# Patient Record
Sex: Male | Born: 1960
Health system: Southern US, Community
[De-identification: ages and names within clinical notes are randomized; demographics above are authoritative.]

## PROBLEM LIST (undated history)

## (undated) DIAGNOSIS — M549 Dorsalgia, unspecified: Secondary | ICD-10-CM

## (undated) DIAGNOSIS — E119 Type 2 diabetes mellitus without complications: Secondary | ICD-10-CM

## (undated) HISTORY — PX: BACK SURGERY: SHX140

---

## 2014-03-14 DIAGNOSIS — M1732 Unilateral post-traumatic osteoarthritis, left knee: Secondary | ICD-10-CM | POA: Insufficient documentation

## 2015-06-26 DIAGNOSIS — E119 Type 2 diabetes mellitus without complications: Secondary | ICD-10-CM | POA: Diagnosis not present

## 2015-06-26 DIAGNOSIS — Z79899 Other long term (current) drug therapy: Secondary | ICD-10-CM | POA: Diagnosis not present

## 2015-06-26 DIAGNOSIS — M25561 Pain in right knee: Secondary | ICD-10-CM | POA: Diagnosis not present

## 2015-06-26 DIAGNOSIS — I10 Essential (primary) hypertension: Secondary | ICD-10-CM | POA: Diagnosis not present

## 2015-06-26 DIAGNOSIS — M545 Low back pain: Secondary | ICD-10-CM | POA: Diagnosis not present

## 2015-07-25 DIAGNOSIS — R5383 Other fatigue: Secondary | ICD-10-CM | POA: Diagnosis not present

## 2015-07-25 DIAGNOSIS — D649 Anemia, unspecified: Secondary | ICD-10-CM | POA: Diagnosis not present

## 2015-07-25 DIAGNOSIS — M129 Arthropathy, unspecified: Secondary | ICD-10-CM | POA: Diagnosis not present

## 2015-07-25 DIAGNOSIS — D539 Nutritional anemia, unspecified: Secondary | ICD-10-CM | POA: Diagnosis not present

## 2015-07-25 DIAGNOSIS — E291 Testicular hypofunction: Secondary | ICD-10-CM | POA: Diagnosis not present

## 2015-07-25 DIAGNOSIS — E7800 Pure hypercholesterolemia, unspecified: Secondary | ICD-10-CM | POA: Diagnosis not present

## 2015-07-25 DIAGNOSIS — Z79899 Other long term (current) drug therapy: Secondary | ICD-10-CM | POA: Diagnosis not present

## 2015-07-25 DIAGNOSIS — I10 Essential (primary) hypertension: Secondary | ICD-10-CM | POA: Diagnosis not present

## 2015-07-25 DIAGNOSIS — R0602 Shortness of breath: Secondary | ICD-10-CM | POA: Diagnosis not present

## 2015-07-25 DIAGNOSIS — E119 Type 2 diabetes mellitus without complications: Secondary | ICD-10-CM | POA: Diagnosis not present

## 2015-07-25 DIAGNOSIS — E559 Vitamin D deficiency, unspecified: Secondary | ICD-10-CM | POA: Diagnosis not present

## 2015-08-06 DIAGNOSIS — M5440 Lumbago with sciatica, unspecified side: Secondary | ICD-10-CM | POA: Diagnosis present

## 2015-08-06 DIAGNOSIS — R0602 Shortness of breath: Secondary | ICD-10-CM | POA: Diagnosis not present

## 2015-08-06 DIAGNOSIS — G8929 Other chronic pain: Secondary | ICD-10-CM | POA: Diagnosis present

## 2015-08-06 DIAGNOSIS — I10 Essential (primary) hypertension: Secondary | ICD-10-CM | POA: Diagnosis present

## 2015-08-06 DIAGNOSIS — M549 Dorsalgia, unspecified: Secondary | ICD-10-CM | POA: Diagnosis not present

## 2015-08-06 DIAGNOSIS — F419 Anxiety disorder, unspecified: Secondary | ICD-10-CM | POA: Diagnosis present

## 2015-08-06 DIAGNOSIS — Z6841 Body Mass Index (BMI) 40.0 and over, adult: Secondary | ICD-10-CM | POA: Diagnosis not present

## 2015-08-06 DIAGNOSIS — R0689 Other abnormalities of breathing: Secondary | ICD-10-CM | POA: Diagnosis not present

## 2015-08-06 DIAGNOSIS — F17210 Nicotine dependence, cigarettes, uncomplicated: Secondary | ICD-10-CM | POA: Diagnosis present

## 2015-08-06 DIAGNOSIS — R740 Nonspecific elevation of levels of transaminase and lactic acid dehydrogenase [LDH]: Secondary | ICD-10-CM | POA: Diagnosis not present

## 2015-08-06 DIAGNOSIS — M1990 Unspecified osteoarthritis, unspecified site: Secondary | ICD-10-CM | POA: Diagnosis present

## 2015-08-06 DIAGNOSIS — R0902 Hypoxemia: Secondary | ICD-10-CM | POA: Diagnosis not present

## 2015-08-06 DIAGNOSIS — Z9119 Patient's noncompliance with other medical treatment and regimen: Secondary | ICD-10-CM | POA: Diagnosis not present

## 2015-08-06 DIAGNOSIS — J180 Bronchopneumonia, unspecified organism: Secondary | ICD-10-CM | POA: Diagnosis present

## 2015-08-06 DIAGNOSIS — E1165 Type 2 diabetes mellitus with hyperglycemia: Secondary | ICD-10-CM | POA: Diagnosis not present

## 2015-08-06 DIAGNOSIS — E7800 Pure hypercholesterolemia, unspecified: Secondary | ICD-10-CM | POA: Diagnosis present

## 2015-08-06 DIAGNOSIS — R935 Abnormal findings on diagnostic imaging of other abdominal regions, including retroperitoneum: Secondary | ICD-10-CM | POA: Diagnosis present

## 2015-08-06 DIAGNOSIS — K760 Fatty (change of) liver, not elsewhere classified: Secondary | ICD-10-CM | POA: Diagnosis present

## 2015-08-06 DIAGNOSIS — Z79899 Other long term (current) drug therapy: Secondary | ICD-10-CM | POA: Diagnosis not present

## 2015-08-06 DIAGNOSIS — K219 Gastro-esophageal reflux disease without esophagitis: Secondary | ICD-10-CM | POA: Diagnosis present

## 2015-08-06 DIAGNOSIS — Z8701 Personal history of pneumonia (recurrent): Secondary | ICD-10-CM | POA: Diagnosis not present

## 2015-08-06 DIAGNOSIS — J9600 Acute respiratory failure, unspecified whether with hypoxia or hypercapnia: Secondary | ICD-10-CM | POA: Diagnosis present

## 2015-08-06 DIAGNOSIS — Z881 Allergy status to other antibiotic agents status: Secondary | ICD-10-CM | POA: Diagnosis not present

## 2015-08-06 DIAGNOSIS — G4733 Obstructive sleep apnea (adult) (pediatric): Secondary | ICD-10-CM | POA: Diagnosis present

## 2015-08-06 DIAGNOSIS — R7989 Other specified abnormal findings of blood chemistry: Secondary | ICD-10-CM | POA: Diagnosis not present

## 2015-08-06 DIAGNOSIS — Z20828 Contact with and (suspected) exposure to other viral communicable diseases: Secondary | ICD-10-CM | POA: Diagnosis present

## 2015-08-06 DIAGNOSIS — Z23 Encounter for immunization: Secondary | ICD-10-CM | POA: Diagnosis not present

## 2015-08-06 DIAGNOSIS — J111 Influenza due to unidentified influenza virus with other respiratory manifestations: Secondary | ICD-10-CM | POA: Diagnosis not present

## 2015-08-06 DIAGNOSIS — I519 Heart disease, unspecified: Secondary | ICD-10-CM | POA: Diagnosis present

## 2015-08-06 DIAGNOSIS — E860 Dehydration: Secondary | ICD-10-CM | POA: Diagnosis not present

## 2015-08-06 DIAGNOSIS — R05 Cough: Secondary | ICD-10-CM | POA: Diagnosis not present

## 2015-08-22 DIAGNOSIS — Z79899 Other long term (current) drug therapy: Secondary | ICD-10-CM | POA: Diagnosis not present

## 2015-08-22 DIAGNOSIS — M545 Low back pain: Secondary | ICD-10-CM | POA: Diagnosis not present

## 2015-08-22 DIAGNOSIS — M5416 Radiculopathy, lumbar region: Secondary | ICD-10-CM | POA: Diagnosis not present

## 2015-08-22 DIAGNOSIS — E119 Type 2 diabetes mellitus without complications: Secondary | ICD-10-CM | POA: Diagnosis not present

## 2015-08-22 DIAGNOSIS — I10 Essential (primary) hypertension: Secondary | ICD-10-CM | POA: Diagnosis not present

## 2015-09-22 DIAGNOSIS — M5136 Other intervertebral disc degeneration, lumbar region: Secondary | ICD-10-CM | POA: Diagnosis not present

## 2015-09-22 DIAGNOSIS — I10 Essential (primary) hypertension: Secondary | ICD-10-CM | POA: Diagnosis not present

## 2015-09-22 DIAGNOSIS — Z79899 Other long term (current) drug therapy: Secondary | ICD-10-CM | POA: Diagnosis not present

## 2015-09-22 DIAGNOSIS — E119 Type 2 diabetes mellitus without complications: Secondary | ICD-10-CM | POA: Diagnosis not present

## 2015-09-22 DIAGNOSIS — M545 Low back pain: Secondary | ICD-10-CM | POA: Diagnosis not present

## 2015-10-21 DIAGNOSIS — I10 Essential (primary) hypertension: Secondary | ICD-10-CM | POA: Diagnosis not present

## 2015-10-21 DIAGNOSIS — E119 Type 2 diabetes mellitus without complications: Secondary | ICD-10-CM | POA: Diagnosis not present

## 2015-10-21 DIAGNOSIS — M25561 Pain in right knee: Secondary | ICD-10-CM | POA: Diagnosis not present

## 2015-10-21 DIAGNOSIS — M25562 Pain in left knee: Secondary | ICD-10-CM | POA: Diagnosis not present

## 2015-10-21 DIAGNOSIS — M545 Low back pain: Secondary | ICD-10-CM | POA: Diagnosis not present

## 2015-10-21 DIAGNOSIS — Z79899 Other long term (current) drug therapy: Secondary | ICD-10-CM | POA: Diagnosis not present

## 2015-10-24 DIAGNOSIS — E119 Type 2 diabetes mellitus without complications: Secondary | ICD-10-CM | POA: Diagnosis not present

## 2015-10-24 DIAGNOSIS — S2241XA Multiple fractures of ribs, right side, initial encounter for closed fracture: Secondary | ICD-10-CM | POA: Diagnosis not present

## 2015-10-24 DIAGNOSIS — Z883 Allergy status to other anti-infective agents status: Secondary | ICD-10-CM | POA: Diagnosis not present

## 2015-10-24 DIAGNOSIS — R7989 Other specified abnormal findings of blood chemistry: Secondary | ICD-10-CM | POA: Diagnosis not present

## 2015-10-24 DIAGNOSIS — R911 Solitary pulmonary nodule: Secondary | ICD-10-CM | POA: Diagnosis not present

## 2015-10-24 DIAGNOSIS — K5730 Diverticulosis of large intestine without perforation or abscess without bleeding: Secondary | ICD-10-CM | POA: Diagnosis not present

## 2015-11-28 DIAGNOSIS — Z79899 Other long term (current) drug therapy: Secondary | ICD-10-CM | POA: Diagnosis not present

## 2015-11-28 DIAGNOSIS — I10 Essential (primary) hypertension: Secondary | ICD-10-CM | POA: Diagnosis not present

## 2015-11-28 DIAGNOSIS — M5136 Other intervertebral disc degeneration, lumbar region: Secondary | ICD-10-CM | POA: Diagnosis not present

## 2015-11-28 DIAGNOSIS — M545 Low back pain: Secondary | ICD-10-CM | POA: Diagnosis not present

## 2015-11-28 DIAGNOSIS — M5416 Radiculopathy, lumbar region: Secondary | ICD-10-CM | POA: Diagnosis not present

## 2015-12-26 DIAGNOSIS — M545 Low back pain: Secondary | ICD-10-CM | POA: Diagnosis not present

## 2015-12-26 DIAGNOSIS — E291 Testicular hypofunction: Secondary | ICD-10-CM | POA: Diagnosis not present

## 2015-12-26 DIAGNOSIS — Z79899 Other long term (current) drug therapy: Secondary | ICD-10-CM | POA: Diagnosis not present

## 2015-12-26 DIAGNOSIS — R5383 Other fatigue: Secondary | ICD-10-CM | POA: Diagnosis not present

## 2015-12-26 DIAGNOSIS — M5136 Other intervertebral disc degeneration, lumbar region: Secondary | ICD-10-CM | POA: Diagnosis not present

## 2015-12-26 DIAGNOSIS — E559 Vitamin D deficiency, unspecified: Secondary | ICD-10-CM | POA: Diagnosis not present

## 2015-12-26 DIAGNOSIS — E119 Type 2 diabetes mellitus without complications: Secondary | ICD-10-CM | POA: Diagnosis not present

## 2015-12-26 DIAGNOSIS — D539 Nutritional anemia, unspecified: Secondary | ICD-10-CM | POA: Diagnosis not present

## 2015-12-26 DIAGNOSIS — M129 Arthropathy, unspecified: Secondary | ICD-10-CM | POA: Diagnosis not present

## 2015-12-26 DIAGNOSIS — R0602 Shortness of breath: Secondary | ICD-10-CM | POA: Diagnosis not present

## 2015-12-26 DIAGNOSIS — E7800 Pure hypercholesterolemia, unspecified: Secondary | ICD-10-CM | POA: Diagnosis not present

## 2015-12-26 DIAGNOSIS — R21 Rash and other nonspecific skin eruption: Secondary | ICD-10-CM | POA: Diagnosis not present

## 2015-12-26 DIAGNOSIS — R300 Dysuria: Secondary | ICD-10-CM | POA: Diagnosis not present

## 2016-01-02 ENCOUNTER — Emergency Department (HOSPITAL_COMMUNITY): Payer: No Typology Code available for payment source

## 2016-01-02 ENCOUNTER — Encounter (HOSPITAL_COMMUNITY): Payer: Self-pay | Admitting: Emergency Medicine

## 2016-01-02 ENCOUNTER — Emergency Department (HOSPITAL_COMMUNITY)
Admission: EM | Admit: 2016-01-02 | Discharge: 2016-01-02 | Disposition: A | Discharge status: Home / Self Care | Payer: No Typology Code available for payment source | Attending: Emergency Medicine | Admitting: Emergency Medicine

## 2016-01-02 DIAGNOSIS — Y92410 Unspecified street and highway as the place of occurrence of the external cause: Secondary | ICD-10-CM | POA: Diagnosis not present

## 2016-01-02 DIAGNOSIS — Y939 Activity, unspecified: Secondary | ICD-10-CM | POA: Diagnosis not present

## 2016-01-02 DIAGNOSIS — Y999 Unspecified external cause status: Secondary | ICD-10-CM | POA: Insufficient documentation

## 2016-01-02 DIAGNOSIS — S39012A Strain of muscle, fascia and tendon of lower back, initial encounter: Secondary | ICD-10-CM | POA: Insufficient documentation

## 2016-01-02 DIAGNOSIS — E119 Type 2 diabetes mellitus without complications: Secondary | ICD-10-CM | POA: Insufficient documentation

## 2016-01-02 DIAGNOSIS — S3992XA Unspecified injury of lower back, initial encounter: Secondary | ICD-10-CM | POA: Diagnosis present

## 2016-01-02 HISTORY — DX: Type 2 diabetes mellitus without complications: E11.9

## 2016-01-02 HISTORY — DX: Dorsalgia, unspecified: M54.9

## 2016-01-02 MED ORDER — NAPROXEN 500 MG PO TABS: 500.0000 mg | ORAL_TABLET | Freq: Two times a day (BID) | ORAL | Status: DC

## 2016-01-02 MED ORDER — CYCLOBENZAPRINE HCL 5 MG PO TABS: 5.0000 mg | ORAL_TABLET | Freq: Two times a day (BID) | ORAL | Status: DC | PRN

## 2016-01-02 NOTE — ED Notes (Signed)
Patient transported to X-ray 
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
## 2016-01-26 DIAGNOSIS — S39012A Strain of muscle, fascia and tendon of lower back, initial encounter: Secondary | ICD-10-CM | POA: Diagnosis not present

## 2016-01-26 DIAGNOSIS — Z79899 Other long term (current) drug therapy: Secondary | ICD-10-CM | POA: Diagnosis not present

## 2016-01-26 DIAGNOSIS — M545 Low back pain: Secondary | ICD-10-CM | POA: Diagnosis not present

## 2016-01-26 DIAGNOSIS — M62838 Other muscle spasm: Secondary | ICD-10-CM | POA: Diagnosis not present

## 2016-01-26 DIAGNOSIS — M5136 Other intervertebral disc degeneration, lumbar region: Secondary | ICD-10-CM | POA: Diagnosis not present

## 2016-04-06 DIAGNOSIS — S6991XA Unspecified injury of right wrist, hand and finger(s), initial encounter: Secondary | ICD-10-CM | POA: Diagnosis not present

## 2016-04-06 DIAGNOSIS — M79641 Pain in right hand: Secondary | ICD-10-CM | POA: Diagnosis not present

## 2016-04-06 DIAGNOSIS — S60011A Contusion of right thumb without damage to nail, initial encounter: Secondary | ICD-10-CM | POA: Diagnosis not present

## 2016-05-04 DIAGNOSIS — R5382 Chronic fatigue, unspecified: Secondary | ICD-10-CM | POA: Diagnosis not present

## 2016-06-14 DIAGNOSIS — E1165 Type 2 diabetes mellitus with hyperglycemia: Secondary | ICD-10-CM | POA: Diagnosis not present

## 2016-06-14 DIAGNOSIS — E118 Type 2 diabetes mellitus with unspecified complications: Secondary | ICD-10-CM | POA: Diagnosis not present

## 2016-06-14 DIAGNOSIS — R110 Nausea: Secondary | ICD-10-CM | POA: Diagnosis not present

## 2016-06-14 DIAGNOSIS — I10 Essential (primary) hypertension: Secondary | ICD-10-CM | POA: Diagnosis not present

## 2016-06-24 DIAGNOSIS — E1165 Type 2 diabetes mellitus with hyperglycemia: Secondary | ICD-10-CM | POA: Insufficient documentation

## 2016-06-24 DIAGNOSIS — F1121 Opioid dependence, in remission: Secondary | ICD-10-CM | POA: Insufficient documentation

## 2016-06-24 DIAGNOSIS — I10 Essential (primary) hypertension: Secondary | ICD-10-CM | POA: Insufficient documentation

## 2016-10-10 DIAGNOSIS — M545 Low back pain: Secondary | ICD-10-CM | POA: Diagnosis not present

## 2016-10-10 DIAGNOSIS — Z7984 Long term (current) use of oral hypoglycemic drugs: Secondary | ICD-10-CM | POA: Diagnosis not present

## 2016-10-10 DIAGNOSIS — M5489 Other dorsalgia: Secondary | ICD-10-CM | POA: Diagnosis not present

## 2016-10-10 DIAGNOSIS — T148XXA Other injury of unspecified body region, initial encounter: Secondary | ICD-10-CM | POA: Diagnosis not present

## 2016-10-10 DIAGNOSIS — Z79899 Other long term (current) drug therapy: Secondary | ICD-10-CM | POA: Diagnosis not present

## 2016-10-10 DIAGNOSIS — E119 Type 2 diabetes mellitus without complications: Secondary | ICD-10-CM | POA: Diagnosis not present

## 2016-10-10 DIAGNOSIS — I10 Essential (primary) hypertension: Secondary | ICD-10-CM | POA: Diagnosis not present

## 2016-10-29 DIAGNOSIS — G4733 Obstructive sleep apnea (adult) (pediatric): Secondary | ICD-10-CM | POA: Insufficient documentation

## 2016-11-01 DIAGNOSIS — Z6841 Body Mass Index (BMI) 40.0 and over, adult: Secondary | ICD-10-CM | POA: Insufficient documentation

## 2017-03-28 DIAGNOSIS — Z79811 Long term (current) use of aromatase inhibitors: Secondary | ICD-10-CM | POA: Diagnosis not present

## 2017-03-28 DIAGNOSIS — Z794 Long term (current) use of insulin: Secondary | ICD-10-CM | POA: Diagnosis not present

## 2017-03-28 DIAGNOSIS — E1165 Type 2 diabetes mellitus with hyperglycemia: Secondary | ICD-10-CM | POA: Diagnosis not present

## 2017-03-28 DIAGNOSIS — E1010 Type 1 diabetes mellitus with ketoacidosis without coma: Secondary | ICD-10-CM | POA: Diagnosis not present

## 2017-03-28 DIAGNOSIS — E1169 Type 2 diabetes mellitus with other specified complication: Secondary | ICD-10-CM | POA: Diagnosis present

## 2017-03-28 DIAGNOSIS — M4623 Osteomyelitis of vertebra, cervicothoracic region: Secondary | ICD-10-CM | POA: Diagnosis not present

## 2017-03-28 DIAGNOSIS — T383X5A Adverse effect of insulin and oral hypoglycemic [antidiabetic] drugs, initial encounter: Secondary | ICD-10-CM | POA: Diagnosis not present

## 2017-03-28 DIAGNOSIS — R109 Unspecified abdominal pain: Secondary | ICD-10-CM | POA: Diagnosis not present

## 2017-03-28 DIAGNOSIS — K7689 Other specified diseases of liver: Secondary | ICD-10-CM | POA: Diagnosis not present

## 2017-03-28 DIAGNOSIS — E785 Hyperlipidemia, unspecified: Secondary | ICD-10-CM | POA: Diagnosis present

## 2017-03-28 DIAGNOSIS — S32030A Wedge compression fracture of third lumbar vertebra, initial encounter for closed fracture: Secondary | ICD-10-CM | POA: Diagnosis not present

## 2017-03-28 DIAGNOSIS — M4626 Osteomyelitis of vertebra, lumbar region: Secondary | ICD-10-CM | POA: Diagnosis present

## 2017-03-28 DIAGNOSIS — R1084 Generalized abdominal pain: Secondary | ICD-10-CM | POA: Diagnosis not present

## 2017-03-28 DIAGNOSIS — T383X6A Underdosing of insulin and oral hypoglycemic [antidiabetic] drugs, initial encounter: Secondary | ICD-10-CM | POA: Diagnosis not present

## 2017-03-28 DIAGNOSIS — M868X8 Other osteomyelitis, other site: Secondary | ICD-10-CM | POA: Diagnosis not present

## 2017-03-28 DIAGNOSIS — Z91128 Patient's intentional underdosing of medication regimen for other reason: Secondary | ICD-10-CM | POA: Diagnosis not present

## 2017-03-28 DIAGNOSIS — M5416 Radiculopathy, lumbar region: Secondary | ICD-10-CM | POA: Diagnosis not present

## 2017-03-28 DIAGNOSIS — E1110 Type 2 diabetes mellitus with ketoacidosis without coma: Secondary | ICD-10-CM | POA: Diagnosis present

## 2017-03-28 DIAGNOSIS — R000 Tachycardia, unspecified: Secondary | ICD-10-CM | POA: Diagnosis not present

## 2017-03-28 DIAGNOSIS — D72829 Elevated white blood cell count, unspecified: Secondary | ICD-10-CM | POA: Diagnosis not present

## 2017-03-28 DIAGNOSIS — D1809 Hemangioma of other sites: Secondary | ICD-10-CM | POA: Diagnosis not present

## 2017-03-28 DIAGNOSIS — E1069 Type 1 diabetes mellitus with other specified complication: Secondary | ICD-10-CM | POA: Diagnosis present

## 2017-03-28 DIAGNOSIS — R739 Hyperglycemia, unspecified: Secondary | ICD-10-CM | POA: Diagnosis not present

## 2017-03-28 DIAGNOSIS — F40240 Claustrophobia: Secondary | ICD-10-CM | POA: Diagnosis present

## 2017-03-28 DIAGNOSIS — M5410 Radiculopathy, site unspecified: Secondary | ICD-10-CM | POA: Diagnosis present

## 2017-03-28 DIAGNOSIS — I10 Essential (primary) hypertension: Secondary | ICD-10-CM | POA: Diagnosis present

## 2017-04-01 DIAGNOSIS — S32039A Unspecified fracture of third lumbar vertebra, initial encounter for closed fracture: Secondary | ICD-10-CM | POA: Insufficient documentation

## 2017-04-29 DIAGNOSIS — M48062 Spinal stenosis, lumbar region with neurogenic claudication: Secondary | ICD-10-CM | POA: Diagnosis not present

## 2017-04-29 DIAGNOSIS — M5416 Radiculopathy, lumbar region: Secondary | ICD-10-CM | POA: Insufficient documentation

## 2017-11-26 DIAGNOSIS — R531 Weakness: Secondary | ICD-10-CM | POA: Diagnosis not present

## 2017-11-26 DIAGNOSIS — E1165 Type 2 diabetes mellitus with hyperglycemia: Secondary | ICD-10-CM | POA: Diagnosis not present

## 2018-04-03 DIAGNOSIS — G4733 Obstructive sleep apnea (adult) (pediatric): Secondary | ICD-10-CM | POA: Diagnosis not present

## 2018-04-03 DIAGNOSIS — Z79899 Other long term (current) drug therapy: Secondary | ICD-10-CM | POA: Diagnosis not present

## 2018-04-03 DIAGNOSIS — Z23 Encounter for immunization: Secondary | ICD-10-CM | POA: Diagnosis not present

## 2018-04-03 DIAGNOSIS — F320 Major depressive disorder, single episode, mild: Secondary | ICD-10-CM | POA: Diagnosis not present

## 2018-04-03 DIAGNOSIS — Z794 Long term (current) use of insulin: Secondary | ICD-10-CM | POA: Diagnosis not present

## 2018-04-03 DIAGNOSIS — E7800 Pure hypercholesterolemia, unspecified: Secondary | ICD-10-CM | POA: Insufficient documentation

## 2018-04-03 DIAGNOSIS — Z6841 Body Mass Index (BMI) 40.0 and over, adult: Secondary | ICD-10-CM | POA: Diagnosis not present

## 2018-04-03 DIAGNOSIS — E1165 Type 2 diabetes mellitus with hyperglycemia: Secondary | ICD-10-CM | POA: Diagnosis not present

## 2018-04-03 DIAGNOSIS — Z1159 Encounter for screening for other viral diseases: Secondary | ICD-10-CM | POA: Diagnosis not present

## 2018-04-03 DIAGNOSIS — I10 Essential (primary) hypertension: Secondary | ICD-10-CM | POA: Diagnosis not present

## 2018-05-20 DIAGNOSIS — R52 Pain, unspecified: Secondary | ICD-10-CM | POA: Diagnosis not present

## 2018-05-20 DIAGNOSIS — I10 Essential (primary) hypertension: Secondary | ICD-10-CM | POA: Diagnosis not present

## 2018-05-20 DIAGNOSIS — G4489 Other headache syndrome: Secondary | ICD-10-CM | POA: Diagnosis not present

## 2018-05-20 DIAGNOSIS — R0602 Shortness of breath: Secondary | ICD-10-CM | POA: Diagnosis not present

## 2018-05-20 DIAGNOSIS — R42 Dizziness and giddiness: Secondary | ICD-10-CM | POA: Diagnosis not present

## 2019-01-11 ENCOUNTER — Ambulatory Visit
Admission: RE | Admit: 2019-01-11 | Discharge: 2019-01-11 | Disposition: A | Discharge status: Home / Self Care | Payer: Medicare Other | Source: Ambulatory Visit | Attending: Family | Admitting: Family

## 2019-01-11 ENCOUNTER — Other Ambulatory Visit: Payer: Self-pay | Admitting: Family Medicine

## 2019-01-11 DIAGNOSIS — R0781 Pleurodynia: Secondary | ICD-10-CM

## 2019-01-11 DIAGNOSIS — S299XXA Unspecified injury of thorax, initial encounter: Secondary | ICD-10-CM | POA: Diagnosis not present

## 2019-02-16 DIAGNOSIS — E1169 Type 2 diabetes mellitus with other specified complication: Secondary | ICD-10-CM | POA: Diagnosis not present

## 2019-02-16 DIAGNOSIS — Z79899 Other long term (current) drug therapy: Secondary | ICD-10-CM | POA: Diagnosis not present

## 2019-02-16 DIAGNOSIS — Z1159 Encounter for screening for other viral diseases: Secondary | ICD-10-CM | POA: Diagnosis not present

## 2019-07-02 ENCOUNTER — Emergency Department (HOSPITAL_COMMUNITY)
Admission: EM | Admit: 2019-07-02 | Discharge: 2019-07-02 | Disposition: A | Discharge status: Home / Self Care | Payer: Medicare Other | Attending: Emergency Medicine | Admitting: Emergency Medicine

## 2019-07-02 ENCOUNTER — Other Ambulatory Visit: Payer: Self-pay

## 2019-07-02 ENCOUNTER — Emergency Department (HOSPITAL_COMMUNITY): Payer: Medicare Other

## 2019-07-02 DIAGNOSIS — Y929 Unspecified place or not applicable: Secondary | ICD-10-CM | POA: Diagnosis not present

## 2019-07-02 DIAGNOSIS — Y999 Unspecified external cause status: Secondary | ICD-10-CM | POA: Diagnosis not present

## 2019-07-02 DIAGNOSIS — S83411A Sprain of medial collateral ligament of right knee, initial encounter: Secondary | ICD-10-CM | POA: Diagnosis not present

## 2019-07-02 DIAGNOSIS — Z79899 Other long term (current) drug therapy: Secondary | ICD-10-CM | POA: Diagnosis not present

## 2019-07-02 DIAGNOSIS — Y939 Activity, unspecified: Secondary | ICD-10-CM | POA: Insufficient documentation

## 2019-07-02 DIAGNOSIS — S8991XA Unspecified injury of right lower leg, initial encounter: Secondary | ICD-10-CM | POA: Diagnosis present

## 2019-07-02 DIAGNOSIS — W19XXXA Unspecified fall, initial encounter: Secondary | ICD-10-CM | POA: Diagnosis not present

## 2019-07-02 DIAGNOSIS — E119 Type 2 diabetes mellitus without complications: Secondary | ICD-10-CM | POA: Insufficient documentation

## 2019-07-02 MED ORDER — IBUPROFEN 800 MG PO TABS: 800.0000 mg | ORAL_TABLET | Freq: Three times a day (TID) | ORAL | 0 refills | Status: DC

## 2019-07-02 NOTE — ED Provider Notes (Signed)
[facility] Provider Note   CSN: [id number] Arrival date & time: 07/02/19  1031     History Chief Complaint  Patient presents with  . Knee Pain    Charles Nunez is a 59 y.o. male with PMH significant for type II DM who presents to the ED with complaints of right knee pain.  Patient reports that approximately 5 days ago he was walking down the stairs when he slipped and got his right foot caught between the balusters and he fell forward, externally rotating his hip and causing significant medial knee discomfort.  Patient reportedly was able to ambulate after the injury and spent time with his grandkids who were in town from [place].  He has not been taking any for his pain or discomfort, but reports that he does have ibuprofen at home.  He reports that when he walks for extended period time, he feels as though his knee is "buckling" as though it will give out on him.  He was sent here by his primary care provider to obtain imaging to rule out fracture or dislocation.  He denies any numbness or tingling, shooting pains, hip discomfort, ankle discomfort, inability to weight-bear or ambulate, fevers or chills, overlying skin changes, or other symptoms.  HPI     Past Medical History:  Diagnosis Date  . Back pain   . Diabetes mellitus without complication ([place])     There are no problems to display for this patient.   No family history on file.  Social History   Tobacco Use  . Smoking status: Never Smoker  Substance Use Topics  . Alcohol use: No  . Drug use: Not on file    Home Medications Prior to Admission medications   Medication Sig Start Date End Date Taking? Authorizing Provider  cyclobenzaprine (FLEXERIL) 5 MG tablet Take 1 tablet (5 mg total) by mouth 2 (two) times daily as needed for muscle spasms. 01/02/16   Margarita Mail, PA-C  ibuprofen (ADVIL) 800 MG tablet Take 1 tablet (800 mg total) by mouth 3 (three) times daily. 07/02/19    Corena Herter, PA-C  naproxen (NAPROSYN) 500 MG tablet Take 1 tablet (500 mg total) by mouth 2 (two) times daily with a meal. 01/02/16   Margarita Mail, PA-C    Allergies    Patient has no known allergies.  Review of Systems   Review of Systems  Constitutional: Negative for chills and fever.  Musculoskeletal: Positive for arthralgias, gait problem and joint swelling.  Skin: Negative for color change and wound.  Neurological: Negative for numbness.    Physical Exam Updated Vital Signs BP (!) 160/138 (BP Location: Right Arm)   Pulse 79   Temp 98.2 F (36.8 C) (Oral)   Resp 16   Ht 5\' 10"  (1.778 m)   Wt (!) 138.3 kg   SpO2 98%   BMI 43.76 kg/m   Physical Exam Vitals and nursing note reviewed. Exam conducted with a chaperone present.  Constitutional:      Appearance: Normal appearance.  HENT:     Head: Normocephalic and atraumatic.  Eyes:     General: No scleral icterus.    Conjunctiva/sclera: Conjunctivae normal.  Cardiovascular:     Rate and Rhythm: Normal rate and regular rhythm.     Pulses: Normal pulses.     Heart sounds: Normal heart sounds.  Pulmonary:     Effort: Pulmonary effort is normal.  Musculoskeletal:     Cervical back: Normal range of  motion and neck supple. No rigidity.     Comments: Right knee: No significant swelling or overlying skin changes.  Exquisite tenderness to palpation over medial aspect.  Discomfort felt deep to the patella with patellar palpation.  No popliteal tenderness.  Distal sensation, pulses, and cap refill intact.  Flexion and extension intact, albeit limited.  Pain with valgus stress testing. Right hip: ROM and strength fully intact.  No TTP.   Right ankle: ROM and strength fully intact.  No TTP.  Skin:    General: Skin is dry.  Neurological:     Mental Status: He is alert.     GCS: GCS eye subscore is 4. GCS verbal subscore is 5. GCS motor subscore is 6.  Psychiatric:        Mood and Affect: Mood normal.        Behavior:  Behavior normal.        Thought Content: Thought content normal.       ED Results / Procedures / Treatments   Labs (all labs ordered are listed, but only abnormal results are displayed) Labs Reviewed - No data to display  EKG None  Radiology DG Knee Complete 4 Views Right  Result Date: 07/02/2019 CLINICAL DATA:  Acute right knee pain after fall last week. EXAM: RIGHT KNEE - COMPLETE 4+ VIEW COMPARISON:  December 14, 2013. FINDINGS: No evidence of fracture, dislocation, or joint effusion. No evidence of arthropathy or other focal bone abnormality. Soft tissues are unremarkable. IMPRESSION: Negative. Electronically Signed   By: Marijo Conception M.D.   On: 07/02/2019 11:27    Procedures Procedures (including critical care time)  Medications Ordered in ED Medications - No data to display  ED Course  I have reviewed the triage vital signs and the nursing notes.  Pertinent labs & imaging results that were available during my care of the patient were reviewed by me and considered in my medical decision making (see chart for details).    MDM Rules/Calculators/A&P                      Patient's history and physical exam is consistent with an MCL strain.  DG right knee complete demonstrates no evidence of fracture, dislocation, or joint effusion.  Patient denies any hx of clots, clotting disorder, or hormone use.  No spreading discomfort or TTP of hamstring or calf.  Low suspicion for DVT or other acute pathology this time.  Hip and ankle demonstrate full ROM and no TTP and no further imaging is warranted at this time.  Patient was able to demonstrate evaluation and weightbearing, albeit with discomfort.  We will place patient in a knee sleeve and provide crutches.  Will provide patient with a referral to orthopedics for ongoing evaluation management.  Patient denies any history of PUD or kidney impairment, will prescribe ibuprofen 800 mg 3 times daily as needed for his discomfort.  Recommending  ice and elevation, as well.  Informed him that he may require further imaging to assess for MCL tear and may ultimately need surgical intervention.  Patient voiced understanding and is agreeable to the plan.   Final Clinical Impression(s) / ED Diagnoses Final diagnoses:  Sprain of medial collateral ligament of right knee, initial encounter    Rx / DC Orders ED Discharge Orders         Ordered    ibuprofen (ADVIL) 800 MG tablet  3 times daily     07/02/19 1244  Corena Herter, PA-C 07/02/19 1244    Little, Wenda Overland, MD 07/02/19 [phone]

## 2019-07-02 NOTE — ED Triage Notes (Signed)
Pt reports right knee pain since Jan 8th, had a fall and twisted knee. Pt reports pain along inner aspect of knee and behind knee.

## 2019-07-02 NOTE — Discharge Instructions (Signed)
Please call Dr. Mardelle Matte to schedule appointment for ongoing evaluation and management of your right MCL sprain.   Please rest, ice, and elevate your leg.  Please keep in compression sleeve given your feelings of instability.  Use crutches and only partially weight-bear.    Please take your medications, as prescribed, for your pain discomfort.  Follow-up with your primary care provider regarding today's encounter.  Please return to the ED or seek medical attention immediately for any new or worsening symptoms.

## 2019-07-06 ENCOUNTER — Other Ambulatory Visit: Payer: Self-pay | Admitting: Orthopedic Surgery

## 2019-07-06 DIAGNOSIS — M25561 Pain in right knee: Secondary | ICD-10-CM

## 2019-07-18 ENCOUNTER — Ambulatory Visit
Admission: RE | Admit: 2019-07-18 | Discharge: 2019-07-18 | Disposition: A | Discharge status: Home / Self Care | Payer: Medicare Other | Source: Ambulatory Visit | Attending: Orthopedic Surgery | Admitting: Orthopedic Surgery

## 2019-07-18 DIAGNOSIS — M25561 Pain in right knee: Secondary | ICD-10-CM

## 2019-11-10 ENCOUNTER — Encounter (HOSPITAL_COMMUNITY): Payer: Self-pay | Admitting: Emergency Medicine

## 2019-11-10 ENCOUNTER — Emergency Department (HOSPITAL_COMMUNITY)
Admission: EM | Admit: 2019-11-10 | Discharge: 2019-11-10 | Disposition: A | Discharge status: Home / Self Care | Payer: Medicare Other | Attending: Emergency Medicine | Admitting: Emergency Medicine

## 2019-11-10 ENCOUNTER — Other Ambulatory Visit: Payer: Self-pay

## 2019-11-10 ENCOUNTER — Emergency Department (HOSPITAL_COMMUNITY): Payer: Medicare Other

## 2019-11-10 DIAGNOSIS — S80212A Abrasion, left knee, initial encounter: Secondary | ICD-10-CM | POA: Diagnosis not present

## 2019-11-10 DIAGNOSIS — S2232XA Fracture of one rib, left side, initial encounter for closed fracture: Secondary | ICD-10-CM | POA: Insufficient documentation

## 2019-11-10 DIAGNOSIS — W109XXA Fall (on) (from) unspecified stairs and steps, initial encounter: Secondary | ICD-10-CM | POA: Diagnosis not present

## 2019-11-10 DIAGNOSIS — S62306A Unspecified fracture of fifth metacarpal bone, right hand, initial encounter for closed fracture: Secondary | ICD-10-CM | POA: Insufficient documentation

## 2019-11-10 DIAGNOSIS — Y999 Unspecified external cause status: Secondary | ICD-10-CM | POA: Insufficient documentation

## 2019-11-10 DIAGNOSIS — Y9301 Activity, walking, marching and hiking: Secondary | ICD-10-CM | POA: Insufficient documentation

## 2019-11-10 DIAGNOSIS — W19XXXA Unspecified fall, initial encounter: Secondary | ICD-10-CM

## 2019-11-10 DIAGNOSIS — Y929 Unspecified place or not applicable: Secondary | ICD-10-CM | POA: Diagnosis not present

## 2019-11-10 DIAGNOSIS — T07XXXA Unspecified multiple injuries, initial encounter: Secondary | ICD-10-CM | POA: Diagnosis present

## 2019-11-10 MED ORDER — OXYCODONE-ACETAMINOPHEN 5-325 MG PO TABS: 1.0000 | ORAL_TABLET | Freq: Four times a day (QID) | ORAL | 0 refills | Status: DC | PRN

## 2019-11-10 MED ORDER — OXYCODONE-ACETAMINOPHEN 5-325 MG PO TABS
1.0000 | ORAL_TABLET | Freq: Once | ORAL | Status: AC
  Administered 2019-11-10: 1 via ORAL
  Filled 2019-11-10: qty 1

## 2019-11-10 MED ORDER — IBUPROFEN 400 MG PO TABS
400.0000 mg | ORAL_TABLET | Freq: Once | ORAL | Status: AC
  Administered 2019-11-10: 400 mg via ORAL
  Filled 2019-11-10 (×2): qty 1

## 2019-11-10 NOTE — ED Notes (Signed)
Pt given incentive spirometer, demonstrated appropriate use.
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
## 2019-11-10 NOTE — Progress Notes (Signed)
Orthopedic Tech Progress Note Patient Details:  Charles Nunez 1960/12/24 [id number]  Ortho Devices Type of Ortho Device: Arm sling, Ulna gutter splint Ortho Device/Splint Location: rue. Ortho Device/Splint Interventions: Ordered, Application, Adjustment  after getting clarification from the dr he requested an ulna gutter. Post Interventions Patient Tolerated: Well Instructions Provided: Care of device, Adjustment of device   Karolee Stamps 11/10/2019, 9:23 PM

## 2019-11-10 NOTE — Discharge Instructions (Addendum)
You have a single broken rib in your left rib cage as well as a fractured right fifth digit of your hand.  Please wear the splint until you are followed up by the hand surgeon who will reevaluate you.  Please use Percocet as needed for pain.  However I recommend using Tylenol ibuprofen consistently as directed below.  Only use Percocet as needed for breakthrough pain.  Please use Tylenol or ibuprofen for pain.  You may use 600 mg ibuprofen every 6 hours or 1000 mg of Tylenol every 6 hours.  You may choose to alternate between the 2.  This would be most effective.  Not to exceed 4 g of Tylenol within 24 hours.  Not to exceed 3200 mg ibuprofen 24 hours.

## 2020-11-03 ENCOUNTER — Encounter (HOSPITAL_COMMUNITY): Payer: Self-pay

## 2020-11-03 ENCOUNTER — Other Ambulatory Visit: Payer: Self-pay

## 2020-11-03 ENCOUNTER — Emergency Department (HOSPITAL_COMMUNITY): Payer: Medicare Other

## 2020-11-03 ENCOUNTER — Emergency Department (HOSPITAL_COMMUNITY)
Admission: EM | Admit: 2020-11-03 | Discharge: 2020-11-03 | Disposition: A | Discharge status: Home / Self Care | Payer: Medicare Other | Attending: Emergency Medicine | Admitting: Emergency Medicine

## 2020-11-03 DIAGNOSIS — R0789 Other chest pain: Secondary | ICD-10-CM | POA: Diagnosis not present

## 2020-11-03 DIAGNOSIS — R0602 Shortness of breath: Secondary | ICD-10-CM | POA: Diagnosis not present

## 2020-11-03 DIAGNOSIS — Z5321 Procedure and treatment not carried out due to patient leaving prior to being seen by health care provider: Secondary | ICD-10-CM | POA: Insufficient documentation

## 2020-11-03 LAB — TROPONIN I (HIGH SENSITIVITY): Troponin I (High Sensitivity): 9 ng/L (ref <18)

## 2020-11-03 LAB — BASIC METABOLIC PANEL
Anion gap: 8 (ref 5–15)
BUN: 8 mg/dL (ref 6–20)
CO2: 29 mmol/L (ref 22–32)
Calcium: 9.6 mg/dL (ref 8.9–10.3)
Chloride: 95 mmol/L — ABNORMAL LOW (ref 98–111)
Creatinine, Ser: 0.84 mg/dL (ref 0.61–1.24)
GFR, Estimated: >60 mL/min (ref >60)
Glucose, Bld: 351 mg/dL — ABNORMAL HIGH (ref 70–99)
Potassium: 5 mmol/L (ref 3.5–5.1)
Sodium: 132 mmol/L — ABNORMAL LOW (ref 135–145)

## 2020-11-03 LAB — CBC WITH DIFFERENTIAL/PLATELET
Abs Immature Granulocytes: 0.05 10*3/uL (ref 0.00–0.07)
Basophils Absolute: 0.1 10*3/uL (ref 0.0–0.1)
Basophils Relative: 0 %
Eosinophils Absolute: 0.2 10*3/uL (ref 0.0–0.5)
Eosinophils Relative: 2 %
HCT: 45 % (ref 39.0–52.0)
Hemoglobin: 14.8 g/dL (ref 13.0–17.0)
Immature Granulocytes: 0 %
Lymphocytes Relative: 28 %
Lymphs Abs: 3.8 10*3/uL (ref 0.7–4.0)
MCH: 29 pg (ref 26.0–34.0)
MCHC: 32.9 g/dL (ref 30.0–36.0)
MCV: 88.1 fL (ref 80.0–100.0)
Monocytes Absolute: 0.9 10*3/uL (ref 0.1–1.0)
Monocytes Relative: 6 %
Neutro Abs: 8.9 10*3/uL — ABNORMAL HIGH (ref 1.7–7.7)
Neutrophils Relative %: 64 %
Platelets: 307 10*3/uL (ref 150–400)
RBC: 5.11 MIL/uL (ref 4.22–5.81)
RDW: 11.9 % (ref 11.5–15.5)
WBC: 13.9 10*3/uL — ABNORMAL HIGH (ref 4.0–10.5)
nRBC: 0 % (ref 0.0–0.2)
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
## 2020-11-03 NOTE — ED Notes (Signed)
Pt told registration he was leaving and had her cut off his arm bracelet

## 2020-12-03 ENCOUNTER — Emergency Department (HOSPITAL_COMMUNITY)
Admission: EM | Admit: 2020-12-03 | Discharge: 2020-12-04 | Discharge status: Against Medical Advice | Payer: Medicare Other

## 2020-12-03 NOTE — ED Notes (Signed)
Called for triage x4 

## 2020-12-03 NOTE — ED Notes (Signed)
Called for triage vitals

## 2021-07-01 ENCOUNTER — Other Ambulatory Visit: Payer: Self-pay

## 2021-07-01 ENCOUNTER — Ambulatory Visit (INDEPENDENT_AMBULATORY_CARE_PROVIDER_SITE_OTHER): Payer: Medicare Other | Admitting: Pulmonary Disease

## 2021-07-01 ENCOUNTER — Encounter: Payer: Self-pay | Admitting: Pulmonary Disease

## 2021-07-01 VITALS — BP 120/78 | HR 97 | Temp 97.8°F | Ht 70.0 in | Wt 264.0 lb

## 2021-07-01 DIAGNOSIS — G4733 Obstructive sleep apnea (adult) (pediatric): Secondary | ICD-10-CM

## 2021-07-01 MED ORDER — ESZOPICLONE 2 MG PO TABS: 2.0000 mg | ORAL_TABLET | Freq: Every evening | ORAL | 3 refills | Status: DC | PRN

## 2021-07-01 NOTE — Patient Instructions (Addendum)
Concern for obstructive sleep apnea  We will schedule you for an in lab sleep study  We will update you with results as soon as reviewed  Tentative follow-up in 3 to 4 months  Continue to work on weight loss efforts  Call us with any significant concerns  Sleep Apnea Sleep apnea is a condition in which breathing pauses or becomes shallow during sleep. People with sleep apnea usually snore loudly. They may have times when they gasp and stop breathing for 10 seconds or more during sleep. This may happen many times during the night. Sleep apnea disrupts your sleep and keeps your body from getting the rest that it needs. This condition can increase your risk of certain health problems, including: Heart attack. Stroke. Obesity. Type 2 diabetes. Heart failure. Irregular heartbeat. High blood pressure. The goal of treatment is to help you breathe normally again. What are the causes? The most common cause of sleep apnea is a collapsed or blocked airway. There are three kinds of sleep apnea: Obstructive sleep apnea. This kind is caused by a blocked or collapsed airway. Central sleep apnea. This kind happens when the part of the brain that controls breathing does not send the correct signals to the muscles that control breathing. Mixed sleep apnea. This is a combination of obstructive and central sleep apnea. What increases the risk? You are more likely to develop this condition if you: Are overweight. Smoke. Have a smaller than normal airway. Are older. Are male. Drink alcohol. Take sedatives or tranquilizers. Have a family history of sleep apnea. Have a tongue or tonsils that are larger than normal. What are the signs or symptoms? Symptoms of this condition include: Trouble staying asleep. Loud snoring. Morning headaches. Waking up gasping. Dry mouth or sore throat in the morning. Daytime sleepiness and tiredness. If you have daytime fatigue because of sleep apnea, you may be  more likely to have: Trouble concentrating. Forgetfulness. Irritability or mood swings. Personality changes. Feelings of depression. Sexual dysfunction. This may include loss of interest if you are male, or erectile dysfunction if you are male. How is this diagnosed? This condition may be diagnosed with: A medical history. A physical exam. A series of tests that are done while you are sleeping (sleep study). These tests are usually done in a sleep lab, but they may also be done at home. How is this treated? Treatment for this condition aims to restore normal breathing and to ease symptoms during sleep. It may involve managing health issues that can affect breathing, such as high blood pressure or obesity. Treatment may include: Sleeping on your side. Using a decongestant if you have nasal congestion. Avoiding the use of depressants, including alcohol, sedatives, and narcotics. Losing weight if you are overweight. Making changes to your diet. Quitting smoking. Using a device to open your airway while you sleep, such as: An oral appliance. This is a custom-made mouthpiece that shifts your lower jaw forward. A continuous positive airway pressure (CPAP) device. This device blows air through a mask when you breathe out (exhale). A nasal expiratory positive airway pressure (EPAP) device. This device has valves that you put into each nostril. A bi-level positive airway pressure (BIPAP) device. This device blows air through a mask when you breathe in (inhale) and breathe out (exhale). Having surgery if other treatments do not work. During surgery, excess tissue is removed to create a wider airway. Follow these instructions at home: Lifestyle Make any lifestyle changes that your health care provider recommends.  Eat a healthy, well-balanced diet. Take steps to lose weight if you are overweight. Avoid using depressants, including alcohol, sedatives, and narcotics. Do not use any products that  contain nicotine or tobacco. These products include cigarettes, chewing tobacco, and vaping devices, such as e-cigarettes. If you need help quitting, ask your health care provider. General instructions Take over-the-counter and prescription medicines only as told by your health care provider. If you were given a device to open your airway while you sleep, use it only as told by your health care provider. If you are having surgery, make sure to tell your health care provider you have sleep apnea. You may need to bring your device with you. Keep all follow-up visits. This is important. Contact a health care provider if: The device that you received to open your airway during sleep is uncomfortable or does not seem to be working. Your symptoms do not improve. Your symptoms get worse. Get help right away if: You develop: Chest pain. Shortness of breath. Discomfort in your back, arms, or stomach. You have: Trouble speaking. Weakness on one side of your body. Drooping in your face. These symptoms may represent a serious problem that is an emergency. Do not wait to see if the symptoms will go away. Get medical help right away. Call your local emergency services (911 in the [place] Do not drive yourself to the hospital. Summary Sleep apnea is a condition in which breathing pauses or becomes shallow during sleep. The most common cause is a collapsed or blocked airway. The goal of treatment is to restore normal breathing and to ease symptoms during sleep. This information is not intended to replace advice given to you by your health care provider. Make sure you discuss any questions you have with your health care provider. Document Revised: 01/07/2021 Document Reviewed: 05/09/2020 Elsevier Patient Education  2022 [employer].

## 2021-07-01 NOTE — Progress Notes (Signed)
Charles Nunez    [id number]    1960-08-22  Primary Care Physician:Lamb, Vianne Bulls, MD  Referring Physician: Cipriano Mile, NP [place],  [place] [id number]  Chief complaint:   Patient with history of obstructive sleep apnea Has difficulty falling asleep, difficulty staying asleep  HPI:  Sleep study many years ago at least 10 years ago-does not recollect severity of sleep apnea at the time Used CPAP for a while but got out of using it  Has lost over 80 pounds over the years  Has difficulty falling asleep, sometimes takes him up to 2 hours to fall asleep, will wake up about 2 hours after falling asleep and not able to go back to sleep Denies any significant dryness of his throat in the mornings No morning headaches Occasional night sweats  No family history of obstructive sleep apnea  Active smoker, quarter of a pack a day Has a history of hypertension, diabetes  Has not previously been on any sleep aids  Will take medications like NyQuil or other medications that help him fall asleep  Outpatient Encounter Medications as of 07/01/2021  Medication Sig   citalopram (CELEXA) 20 MG tablet Take 20 mg by mouth daily.   FARXIGA 10 MG TABS tablet Take 10 mg by mouth every morning.   gabapentin (NEURONTIN) 600 MG tablet Take 600 mg by mouth 3 (three) times daily.   glipiZIDE (GLUCOTROL XL) 10 MG 24 hr tablet Take 10 mg by mouth every morning.   lisinopril (ZESTRIL) 40 MG tablet Take 40 mg by mouth daily.   simvastatin (ZOCOR) 40 MG tablet SMARTSIG:1 Tablet(s) By Mouth Every Evening   TRESIBA FLEXTOUCH 100 UNIT/ML FlexTouch Pen SMARTSIG:20 Unit(s) SUB-Q Daily   TRULICITY 1.5 GE/3.6OQ SOPN Inject into the skin.   [DISCONTINUED] cyclobenzaprine (FLEXERIL) 5 MG tablet Take 1 tablet (5 mg total) by mouth 2 (two) times daily as needed for muscle spasms. (Patient not taking: Reported on 07/01/2021)   [DISCONTINUED] ibuprofen (ADVIL) 800 MG tablet Take 1 tablet (800 mg  total) by mouth 3 (three) times daily. (Patient not taking: Reported on 07/01/2021)   [DISCONTINUED] metFORMIN (GLUCOPHAGE-XR) 500 MG 24 hr tablet Take by mouth. (Patient not taking: Reported on 07/01/2021)   [DISCONTINUED] naproxen (NAPROSYN) 500 MG tablet Take 1 tablet (500 mg total) by mouth 2 (two) times daily with a meal. (Patient not taking: Reported on 07/01/2021)   [DISCONTINUED] nebivolol (BYSTOLIC) 10 MG tablet Take by mouth. (Patient not taking: Reported on 07/01/2021)   [DISCONTINUED] oxyCODONE-acetaminophen (PERCOCET/ROXICET) 5-325 MG tablet Take 1 tablet by mouth every 6 (six) hours as needed for severe pain. (Patient not taking: Reported on 07/01/2021)   [DISCONTINUED] penicillin v potassium (VEETID) 500 MG tablet Take 500 mg by mouth 4 (four) times daily. (Patient not taking: Reported on 07/01/2021)   No facility-administered encounter medications on file as of 07/01/2021.    Allergies as of 07/01/2021 - Review Complete 07/01/2021  Allergen Reaction Noted   Vancomycin Other (See Comments) 10/24/2015    Past Medical History:  Diagnosis Date   Back pain    Diabetes mellitus without complication ([place])     Past Surgical History:  Procedure Laterality Date   BACK SURGERY      Family History  Problem Relation Age of Onset   Diabetes Mother    Diabetes Father    Alpha-1 antitrypsin deficiency Neg Hx    Lung cancer Neg Hx     Social History  Socioeconomic History   Marital status: Married    Spouse name: Not on file   Number of children: Not on file   Years of education: Not on file   Highest education level: Not on file  Occupational History   Not on file  Tobacco Use   Smoking status: Every Day    Packs/day: 0.25    Types: Cigarettes    Start date: 06/23/1978   Smokeless tobacco: Never  Substance and Sexual Activity   Alcohol use: No   Drug use: Not Currently   Sexual activity: Not on file  Other Topics Concern   Not on file  Social History Narrative   Not  on file   Social Determinants of Health   Financial Resource Strain: Not on file  Food Insecurity: Not on file  Transportation Needs: Not on file  Physical Activity: Not on file  Stress: Not on file  Social Connections: Not on file  Intimate Partner Violence: Not on file    Review of Systems  Constitutional:  Positive for fatigue.  Respiratory:  Positive for apnea.   Psychiatric/Behavioral:  Positive for sleep disturbance.    Vitals:   07/01/21 1359  BP: 120/78  Pulse: 97  Temp: 97.8 F (36.6 C)  SpO2: 98%     Physical Exam Constitutional:      Appearance: He is obese.  HENT:     Head: Normocephalic.     Mouth/Throat:     Mouth: Mucous membranes are moist.     Comments: Mallampati 4, macroglossia, crowded oropharynx Neck:     Comments: Neck is thick Cardiovascular:     Rate and Rhythm: Normal rate and regular rhythm.     Heart sounds: No murmur heard.   No friction rub.  Pulmonary:     Effort: No respiratory distress.     Breath sounds: No stridor. No wheezing or rhonchi.  Musculoskeletal:     Cervical back: No rigidity or tenderness.  Neurological:     Mental Status: He is alert.    Results of the Epworth flowsheet 07/01/2021  Sitting and reading 1  Watching TV 1  Sitting, inactive in a public place (e.g. a theatre or a meeting) 0  As a passenger in a car for an hour without a break 1  Lying down to rest in the afternoon when circumstances permit 0  Sitting and talking to someone 0  Sitting quietly after a lunch without alcohol 0  In a car, while stopped for a few minutes in traffic 0  Total score 3    Data Reviewed: Prior sleep study not available to be reviewed  Assessment:  Sleep onset and sleep maintenance insomnia -No specific reason for difficulty sleeping although does have musculoskeletal pain and discomfort for which he is on gabapentin  Daytime fatigue -Gabapentin may contribute to this  Past history of obstructive sleep apnea -He is  obese, although has lost a lot of weight recently -He has a crowded oropharynx, Mallampati 4, thick neck  Pathophysiology of sleep disordered breathing discussed with the patient  Treatment options for sleep disordered breathing discussed with the patient  Importance of continuing to work on weight loss efforts discussed with the patient  Gabapentin may contribute to respiratory depression  Plan/Recommendations: We will schedule patient for an in lab split-night study  Trial with Lunesta to help him get some rest as his insomnia is contributing significantly to inability to function  I will see him back in about 3 to 4  months  Encouraged to call with any significant concerns   Sherrilyn Rist MD [facility] 07/01/2021, 2:04 PM  CC: Cipriano Mile, NP

## 2021-07-03 ENCOUNTER — Other Ambulatory Visit: Payer: Self-pay

## 2021-07-03 ENCOUNTER — Ambulatory Visit: Payer: Medicare Other | Admitting: Podiatry

## 2021-07-03 ENCOUNTER — Encounter: Payer: Self-pay | Admitting: Podiatry

## 2021-07-03 DIAGNOSIS — I10 Essential (primary) hypertension: Secondary | ICD-10-CM

## 2021-07-03 DIAGNOSIS — E1165 Type 2 diabetes mellitus with hyperglycemia: Secondary | ICD-10-CM | POA: Diagnosis not present

## 2021-07-03 DIAGNOSIS — Z794 Long term (current) use of insulin: Secondary | ICD-10-CM | POA: Diagnosis not present

## 2021-07-03 DIAGNOSIS — W450XXA Nail entering through skin, initial encounter: Secondary | ICD-10-CM

## 2021-07-03 NOTE — Progress Notes (Signed)
This patient presents to the office concerned about his blackened big toenail right foot.  He denies trauma or injury.  No drainage noted from the nail.  The nail is unattached to nail bed.  He is diabetic.  He presents to the office for evaluation and treatment of his nail and diabetic foot   Vascular  Dorsalis pedis and posterior tibial pulses are palpable  B/L.  Capillary return  WNL.  Temperature gradient is  WNL.  Skin turgor  WNL  Sensorium  Senn Weinstein monofilament wire  left foot WNL. LOPS right foot absent.    Nail Exam  Patient has normal nails with no evidence of bacterial or fungal infection. Right hallux toenail is unattached to nail bed right hallux. Subungual hematoma.  Orthopedic  Exam  Muscle tone and muscle strength  WNL.  No limitations of motion feet  B/L.  No crepitus or joint effusion noted.  Foot type is unremarkable and digits show no abnormalities.  Bony prominences are unremarkable.  Skin  No open lesions.  Normal skin texture and turgor.   Injured right hallux nail   Diabetes.  IE.  Nail avulsion right hallux toenail.  Diabetic foot exam  .  LOPS found to be absent right foot.  RTC prn.   Gardiner Barefoot DPM

## 2021-08-02 ENCOUNTER — Ambulatory Visit (HOSPITAL_BASED_OUTPATIENT_CLINIC_OR_DEPARTMENT_OTHER): Payer: Medicare Other | Attending: Pulmonary Disease | Admitting: Pulmonary Disease

## 2021-08-02 ENCOUNTER — Other Ambulatory Visit: Payer: Self-pay

## 2021-08-02 DIAGNOSIS — G478 Other sleep disorders: Secondary | ICD-10-CM | POA: Diagnosis not present

## 2021-08-02 DIAGNOSIS — F518 Other sleep disorders not due to a substance or known physiological condition: Secondary | ICD-10-CM | POA: Diagnosis not present

## 2021-08-02 DIAGNOSIS — E1165 Type 2 diabetes mellitus with hyperglycemia: Secondary | ICD-10-CM | POA: Insufficient documentation

## 2021-08-02 DIAGNOSIS — Z6838 Body mass index (BMI) 38.0-38.9, adult: Secondary | ICD-10-CM | POA: Insufficient documentation

## 2021-08-02 DIAGNOSIS — G4733 Obstructive sleep apnea (adult) (pediatric): Secondary | ICD-10-CM | POA: Insufficient documentation

## 2021-08-02 DIAGNOSIS — E7800 Pure hypercholesterolemia, unspecified: Secondary | ICD-10-CM | POA: Insufficient documentation

## 2021-08-02 DIAGNOSIS — G4710 Hypersomnia, unspecified: Secondary | ICD-10-CM | POA: Diagnosis not present

## 2021-08-02 DIAGNOSIS — G4731 Primary central sleep apnea: Secondary | ICD-10-CM | POA: Diagnosis not present

## 2021-08-02 DIAGNOSIS — M5416 Radiculopathy, lumbar region: Secondary | ICD-10-CM | POA: Diagnosis not present

## 2021-08-02 DIAGNOSIS — R5383 Other fatigue: Secondary | ICD-10-CM | POA: Insufficient documentation

## 2021-08-02 DIAGNOSIS — F32A Depression, unspecified: Secondary | ICD-10-CM | POA: Insufficient documentation

## 2021-08-02 DIAGNOSIS — F1121 Opioid dependence, in remission: Secondary | ICD-10-CM | POA: Diagnosis not present

## 2021-08-02 DIAGNOSIS — F513 Sleepwalking [somnambulism]: Secondary | ICD-10-CM | POA: Diagnosis not present

## 2021-08-02 DIAGNOSIS — I10 Essential (primary) hypertension: Secondary | ICD-10-CM | POA: Insufficient documentation

## 2021-08-06 ENCOUNTER — Telehealth: Payer: Self-pay | Admitting: Pulmonary Disease

## 2021-08-06 DIAGNOSIS — G4733 Obstructive sleep apnea (adult) (pediatric): Secondary | ICD-10-CM

## 2021-08-06 NOTE — Telephone Encounter (Signed)
Call patient  Sleep study result  Date of study: 08/02/2021  Impression: Severe obstructive sleep apnea  Recommendation: DME referral  Recommend CPAP therapy for severe obstructive sleep apnea  Trial of CPAP therapy on 13 cm H2O, C-Flex of 3 with a Large size Resmed Nasal Pillow AirFit P10 mask and heated humidification.  Encourage weight loss measures  Follow-up in the office 4 to 6 weeks following initiation of treatment

## 2021-08-06 NOTE — Procedures (Signed)
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 Indications include Diabetes, Excessive Daytime Sleepiness, Fatigue, Hypertension, OSA, Re-Evaluation, Sleep walking/talking/parasomnias, Snoring, Witnessed Apneas.  MEDICATIONS Patient self administered medications include: N/A. Medications administered during study include No sleep medicine administered.  SLEEP STUDY TECHNIQUE The patient underwent an attended overnight level one polysomnography titration to assess the effects of CPAP therapy. The following variables were monitored: EEG (C4-A1, C3-A2, O1-A2, O2-A1), EOG, submental and leg EMG, ECG, oxyhemoglobin saturation by pulse oximetry, thoracic and abdominal respiratory effort belts, nasal/oral airflow by pressure sensor, body position sensor and snoring sensor. CPAP pressure was titrated to eliminate apneas, hypopneas and oxygen desaturation. Hypopneas were scored per AASM definition IB (4% desaturation)  The NPSG portion of the study ended at 1:40:35 AM . The CPAP titration was initiated at 2:00:42 AM AM with the CPAP portion of the study ending at 5:38:29 AM.  TECHNICIAN COMMENTS Comments added by Technician: Patient woke 30 minutes early and wanted to remove the mask. Comments added by Scorer: N/A SLEEP ARCHITECTURE The recording time for the entire night was 366.1 minutes. The diagnostic portion was initiated at 11:32:25 PM and terminated at 1:40:35 AM. The  time in bed was 128.2 minutes. EEG confirmed total sleep time was 98.2 minutes yielding a sleep efficiency of 76.6%%. Sleep onset after lights out was 0.0 minutes with a REM latency of N/A minutes. The patient spent 63.3%% of the night in stage N1 sleep, 36.7%% in stage N2 sleep, 0.0%% in stage N3 and 0% in REM. The Arousal Index was 57.5/hour.  The titration portion was initiated at 2:00:42 AM and terminated at 5:38:29 AM. The time in bed was 217.8 minutes. EEG confirmed total sleep time was 160.5 minutes yielding a sleep efficiency of 73.7%%. Sleep onset after CPAP initiation was 0.5 minutes with a REM latency of 6.5 minutes. The patient spent 8.4%% of the night in stage N1 sleep, 69.2%% in stage N2 sleep, 0.0%% in stage N3 and 22.4% in REM. The Arousal Index was 7.1/hour. RESPIRATORY PARAMETERS During the diagnostic portion, there were a total of 130 respiratory disturbances recorded; 83 apneas ( 83 obstructive, 0 mixed, 0 central), 47 hypopneas and 0 RERAs. The apnea/hypopnea index 79.5 was events/hour and the RDI was 79.5 events/hour. The central sleep apnea index was 0.0 events/hour. The REM AHI was N/A /h and NREM AHI was 79.5/h. The REM RDI was 0.0 /h and NREM RDI was 79.5 /h. The supine AHI was 82.6/h, and the non supine AHI was 77.5/h; supine during 37.7%% of sleep. The supine RDI was 82.6/h, and the non supine RDI was 77.54/h. Respiratory disturbances were associated with oxygen desaturation down to a nadir of 74.0 % during sleep. The mean oxygen saturation during the study was 89.7%. The cumulative time under 88% oxygen saturation was 37.3 minutes.  During the titration portion, the apnea/hypopnea index (AHI) was 5.2 events/hour and the RDI was 7.9 events/hour. The central sleep apnea index was events/hour. The most appropriate setting of CPAP was IPAP/EPAP 13/13 cm H2O. At this setting, the  sleep efficiency was 48% and the patient was supine for 100%. The AHI was 0 events per hour(with 0 central  events). Oxygen nadir was 91.0. LEG MOVEMENT DATA The periodic limb movement index was 0.0/hour with an associated arousal index of /hour. CARDIAC DATA The underlying cardiac rhythm was most consistent with sinus rhythm. Mean heart rate was 74.0 during diagnostic portion and 76.1 during titration portion of study. Additional rhythm abnormalities include None.  IMPRESSIONS - Severe Obstructive Sleep apnea(OSA) Optimal pressure attained. - EKG showed no cardiac abnormalities. - No Significant Central Sleep Apnea (CSA) - Severe Oxygen Desaturation - The patient snored with moderate snoring volume. - EEG did not show alpha intrusion. - No significant periodic leg movements(PLMs) during sleep. However, no significant associated arousals. - Reduced sleep efficiency, short primary sleep latency, long REM sleep latency and no slow wave latency.  DIAGNOSIS - Severe Obstructive Sleep Apnea (G47.33)  RECOMMENDATIONS - Trial of CPAP therapy on 13 cm H2O, C-Flex of 3 with a Large size Resmed Nasal Pillow AirFit P10 mask and heated humidification. - Avoid alcohol, sedatives and other CNS depressants that may worsen sleep apnea and disrupt normal sleep architecture. - Sleep hygiene should be reviewed to assess factors that may improve sleep quality. - Weight management and regular exercise should be initiated or continued. - Return to Sleep Center for re-evaluation after 4 weeks of therapy  [Electronically signed] 08/06/2021 08:57 AM  Sherrilyn Rist MD NPI: [id number]

## 2021-08-07 NOTE — Telephone Encounter (Signed)
I called the patient and spoke with his wife on DPR. She voice understanding. I have placed an order  and the wife is aware that she will get a call once they have the order and the machine and she will call to make a 6 week follow up after starting CPAP.

## 2021-09-10 ENCOUNTER — Telehealth: Payer: Self-pay | Admitting: Pulmonary Disease

## 2021-09-10 NOTE — Telephone Encounter (Signed)
?  There is an order for set up that was received on 2-28--23  , but we have not been able to reach patient to schedule him. Last attempt was 3-29- we left voice mail and sent the scheduling letter. He need to call in using the instructions on the voice mail or he can call [phone] and ask for the [place] office.  ? ?Thank you,  ? ?Brad New  ? ?I called to verify that patient wanted to keep appointment and I had to leave a message.  ?

## 2021-09-11 ENCOUNTER — Ambulatory Visit: Payer: Medicare Other | Admitting: Pulmonary Disease

## 2021-09-15 NOTE — Telephone Encounter (Signed)
The patient did not show up for an OV. Closing encounter.  ?

## 2021-11-09 ENCOUNTER — Other Ambulatory Visit: Payer: Self-pay

## 2021-11-09 ENCOUNTER — Emergency Department (HOSPITAL_BASED_OUTPATIENT_CLINIC_OR_DEPARTMENT_OTHER)
Admission: EM | Admit: 2021-11-09 | Discharge: 2021-11-09 | Disposition: A | Discharge status: Home / Self Care | Payer: Medicare Other | Attending: Emergency Medicine | Admitting: Emergency Medicine

## 2021-11-09 ENCOUNTER — Emergency Department (HOSPITAL_BASED_OUTPATIENT_CLINIC_OR_DEPARTMENT_OTHER): Payer: Medicare Other | Admitting: Radiology

## 2021-11-09 DIAGNOSIS — S4992XA Unspecified injury of left shoulder and upper arm, initial encounter: Secondary | ICD-10-CM | POA: Diagnosis present

## 2021-11-09 DIAGNOSIS — X500XXA Overexertion from strenuous movement or load, initial encounter: Secondary | ICD-10-CM | POA: Insufficient documentation

## 2021-11-09 DIAGNOSIS — Y92007 Garden or yard of unspecified non-institutional (private) residence as the place of occurrence of the external cause: Secondary | ICD-10-CM | POA: Diagnosis not present

## 2021-11-09 DIAGNOSIS — S46212A Strain of muscle, fascia and tendon of other parts of biceps, left arm, initial encounter: Secondary | ICD-10-CM | POA: Diagnosis not present

## 2021-11-09 MED ORDER — IBUPROFEN 800 MG PO TABS: 800.0000 mg | ORAL_TABLET | Freq: Three times a day (TID) | ORAL | 0 refills | Status: DC | PRN

## 2021-11-09 MED ORDER — KETOROLAC TROMETHAMINE 30 MG/ML IJ SOLN
30.0000 mg | Freq: Once | INTRAMUSCULAR | Status: AC
  Administered 2021-11-09: 30 mg via INTRAMUSCULAR
  Filled 2021-11-09: qty 1

## 2021-11-09 MED ORDER — OXYCODONE HCL 5 MG PO TABS: 5.0000 mg | ORAL_TABLET | ORAL | 0 refills | Status: AC | PRN

## 2021-11-09 MED ORDER — OXYCODONE HCL 5 MG PO TABS
10.0000 mg | ORAL_TABLET | Freq: Once | ORAL | Status: AC
  Administered 2021-11-09: 10 mg via ORAL
  Filled 2021-11-09: qty 2

## 2021-11-09 NOTE — ED Notes (Addendum)
Pt verbalizes understanding of discharge instructions. Opportunity for questioning and answers were provided. Pt discharged from ED to home with family.    

## 2021-11-09 NOTE — ED Triage Notes (Signed)
Pt was lifting something in garden and felt arm pop multiple times. Pt left bicep swollen and pain in left shoulder. Pt denies any pain in lower are and hand.

## 2021-11-09 NOTE — ED Provider Notes (Signed)
[place] EMERGENCY DEPT Provider Note   CSN: [id number] Arrival date & time: 11/09/21  1918     History  Chief Complaint  Patient presents with   Arm Injury    Charles Nunez is a 61 y.o. male.  Pt is a 61 yo male presenting for arm injury. Pt states he was he was lifting a piece of wood with his left arm from the ground to above shoulder level when he heard a tearing sound and had immediate pain in his left shoulder and biceps. Admits to burning sensation. Denies motor deficits. Denies prior shoulder/left arm injuries.    The history is provided by the patient. No language interpreter was used.  Arm Injury Associated symptoms: no back pain and no fever       Home Medications Prior to Admission medications   Medication Sig Start Date End Date Taking? Authorizing Provider  ibuprofen (ADVIL) 800 MG tablet Take 1 tablet (800 mg total) by mouth every 8 (eight) hours as needed. 6/62/94  Yes Campbell Stall P, DO  oxyCODONE (ROXICODONE) 5 MG immediate release tablet Take 1 tablet (5 mg total) by mouth every 4 (four) hours as needed for up to 3 days for severe pain. 11/09/21 12/17/52 Yes Campbell Stall P, DO  citalopram (CELEXA) 20 MG tablet Take 20 mg by mouth daily. 05/28/21   [provider]  eszopiclone (LUNESTA) 2 MG TABS tablet Take 1 tablet (2 mg total) by mouth at bedtime as needed for sleep. Take immediately before bedtime 07/01/21   Olalere, Adewale A, MD  FARXIGA 10 MG TABS tablet Take 10 mg by mouth every morning. 06/24/21   [provider]  gabapentin (NEURONTIN) 600 MG tablet Take 600 mg by mouth 3 (three) times daily. 06/25/21   [provider]  glipiZIDE (GLUCOTROL XL) 10 MG 24 hr tablet Take 10 mg by mouth every morning. 06/24/21   [provider]  lisinopril (ZESTRIL) 40 MG tablet Take 40 mg by mouth daily. 06/25/21   [provider]  simvastatin (ZOCOR) 40 MG tablet SMARTSIG:1 Tablet(s) By Mouth Every Evening 05/29/21    [provider]  TRESIBA FLEXTOUCH 100 UNIT/ML FlexTouch Pen SMARTSIG:20 Unit(s) SUB-Q Daily 04/25/21   [provider]  TRULICITY 1.5 YT/0.3TW SOPN Inject into the skin. 05/21/21   [provider]      Allergies    Vancomycin    Review of Systems   Review of Systems  Constitutional:  Negative for chills and fever.  HENT:  Negative for ear pain and sore throat.   Eyes:  Negative for pain and visual disturbance.  Respiratory:  Negative for cough and shortness of breath.   Cardiovascular:  Negative for chest pain and palpitations.  Gastrointestinal:  Negative for abdominal pain and vomiting.  Genitourinary:  Negative for dysuria and hematuria.  Musculoskeletal:  Negative for arthralgias and back pain.  Skin:  Negative for color change and rash.  Neurological:  Negative for seizures and syncope.  All other systems reviewed and are negative.  Physical Exam Updated Vital Signs BP (!) 172/102 (BP Location: Right Arm)   Pulse 85   Temp 98.2 F (36.8 C)   Resp 19   Ht '5\' 10"'$  (1.778 m)   Wt 122.5 kg   SpO2 100%   BMI 38.74 kg/m  Physical Exam Vitals and nursing note reviewed.  Constitutional:      General: He is not in acute distress.    Appearance: He is well-developed.  HENT:  Head: Normocephalic and atraumatic.  Eyes:     Conjunctiva/sclera: Conjunctivae normal.  Cardiovascular:     Rate and Rhythm: Normal rate and regular rhythm.     Heart sounds: No murmur heard. Pulmonary:     Effort: Pulmonary effort is normal. No respiratory distress.     Breath sounds: Normal breath sounds.  Abdominal:     Palpations: Abdomen is soft.     Tenderness: There is no abdominal tenderness.  Musculoskeletal:        General: No swelling.     Cervical back: Neck supple.  Skin:    General: Skin is warm and dry.     Capillary Refill: Capillary refill takes less than 2 seconds.  Neurological:     Mental Status: He is alert.  Psychiatric:        Mood and  Affect: Mood normal.    ED Results / Procedures / Treatments   Labs (all labs ordered are listed, but only abnormal results are displayed) Labs Reviewed - No data to display  EKG None  Radiology DG Shoulder Left  Result Date: 11/09/2021 CLINICAL DATA:  Lifting something and garden and felt arm pop multiple times. Left biceps swollen with pain in left shoulder. "Dent" in left bicep. EXAM: LEFT SHOULDER - 2+ VIEW; LEFT HUMERUS - 2+ VIEW COMPARISON:  None Available. FINDINGS: Left shoulder: There is diffuse decreased bone mineralization. Moderate inferior and mild superior glenoid joint space narrowing with mild inferior degenerative osteophytosis. Mild to moderate acromioclavicular joint space narrowing and peripheral osteophytosis. No acute fracture is seen. No dislocation. Left humerus: In addition to the 3 shoulder images, a single view was provided of the left humerus. No acute fracture is seen within the left humerus. Normal alignment of the elbow joint with mild medial elbow joint space narrowing. IMPRESSION: 1. No acute fracture is seen within left shoulder or left humerus. 2. 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 Electronically Signed   By: Yvonne Kendall M.D.   On: 11/09/2021 20:05   DG Humerus Left  Result Date: 11/09/2021 CLINICAL DATA:  Lifting something and garden and felt arm pop multiple times. Left biceps swollen with pain in left shoulder. "Dent" in left bicep. EXAM: LEFT SHOULDER - 2+ VIEW; LEFT HUMERUS - 2+ VIEW COMPARISON:  None Available. FINDINGS: Left shoulder: There is diffuse decreased bone mineralization. Moderate inferior and mild superior glenoid joint space narrowing with mild inferior degenerative osteophytosis. Mild to moderate acromioclavicular joint space narrowing and peripheral osteophytosis. No acute fracture is seen. No dislocation. Left humerus: In addition to the 3 shoulder images, a single view was provided of the left humerus. No  acute fracture is seen within the left humerus. Normal alignment of the elbow joint with mild medial elbow joint space narrowing. IMPRESSION: 1. No acute fracture is seen within left shoulder or left humerus. 2. 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 Electronically Signed   By: Yvonne Kendall M.D.   On: 11/09/2021 20:05    Procedures Procedures    Medications Ordered in ED Medications  oxyCODONE (Oxy IR/ROXICODONE) immediate release tablet 10 mg (has no administration in time range)  ketorolac (TORADOL) 30 MG/ML injection 30 mg (has no administration in time range)    ED Course/ Medical Decision Making/ A&P                           Medical Decision Making Amount and/or Complexity of Data Reviewed Radiology: ordered.  Risk  Prescription drug management.   9:38 PM  61 yo male presenting for arm injury.  Patient is alert and x3, no acute distress, afebrile, stable vital signs.  Physical exam demonstrates mass at the distal end of the biceps concerning for a proximal bicep tender rupture.  Patient neurovascularly intact.  Still able to flex at the elbow joint.  Given sling, medication for pain control, and close follow-up with orthopedic surgery with recommendations for first thing tomorrow morning call make an appointment.  No bony tenderness.  Low suspicion for fractures.  Patient in no distress and overall condition improved here in the ED. Detailed discussions were had with the patient regarding current findings, and need for close f/u with PCP or on call doctor. The patient has been instructed to return immediately if the symptoms worsen in any way for re-evaluation. Patient verbalized understanding and is in agreement with current care plan. All questions answered prior to discharge.         Final Clinical Impression(s) / ED Diagnoses Final diagnoses:  Rupture of left biceps tendon, initial encounter    Rx / DC Orders ED Discharge Orders           Ordered    ibuprofen (ADVIL) 800 MG tablet  Every 8 hours PRN        11/09/21 2136    oxyCODONE (ROXICODONE) 5 MG immediate release tablet  Every 4 hours PRN        11/09/21 2136              Lianne Cure, DO [birth year] 2139

## 2021-11-09 NOTE — Discharge Instructions (Addendum)
Where sling, rest, ice, motrin for mild pain, add Oxycodone (narcotic) for severe pain. Please follow up with orthopedic surgery this week for bicep tendon rupture follow up.

## 2023-02-10 ENCOUNTER — Other Ambulatory Visit: Payer: Self-pay | Admitting: Student

## 2023-02-10 DIAGNOSIS — I70213 Atherosclerosis of native arteries of extremities with intermittent claudication, bilateral legs: Secondary | ICD-10-CM

## 2023-02-22 ENCOUNTER — Ambulatory Visit
Admission: RE | Admit: 2023-02-22 | Discharge: 2023-02-22 | Disposition: A | Discharge status: Home / Self Care | Payer: Medicare Other | Source: Ambulatory Visit | Attending: Student

## 2023-02-22 DIAGNOSIS — I70213 Atherosclerosis of native arteries of extremities with intermittent claudication, bilateral legs: Secondary | ICD-10-CM

## 2023-09-15 ENCOUNTER — Encounter (INDEPENDENT_AMBULATORY_CARE_PROVIDER_SITE_OTHER): Payer: Self-pay

## 2024-01-17 ENCOUNTER — Emergency Department (HOSPITAL_BASED_OUTPATIENT_CLINIC_OR_DEPARTMENT_OTHER)
Admission: EM | Admit: 2024-01-17 | Discharge: 2024-01-17 | Disposition: A | Discharge status: Home / Self Care | Attending: Emergency Medicine | Admitting: Emergency Medicine

## 2024-01-17 ENCOUNTER — Telehealth (HOSPITAL_BASED_OUTPATIENT_CLINIC_OR_DEPARTMENT_OTHER): Payer: Self-pay | Admitting: Emergency Medicine

## 2024-01-17 ENCOUNTER — Emergency Department (HOSPITAL_BASED_OUTPATIENT_CLINIC_OR_DEPARTMENT_OTHER): Admitting: Radiology

## 2024-01-17 ENCOUNTER — Encounter (HOSPITAL_BASED_OUTPATIENT_CLINIC_OR_DEPARTMENT_OTHER): Payer: Self-pay | Admitting: Emergency Medicine

## 2024-01-17 ENCOUNTER — Other Ambulatory Visit: Payer: Self-pay

## 2024-01-17 DIAGNOSIS — Y92410 Unspecified street and highway as the place of occurrence of the external cause: Secondary | ICD-10-CM | POA: Diagnosis not present

## 2024-01-17 DIAGNOSIS — M79641 Pain in right hand: Secondary | ICD-10-CM | POA: Insufficient documentation

## 2024-01-17 DIAGNOSIS — S2241XA Multiple fractures of ribs, right side, initial encounter for closed fracture: Secondary | ICD-10-CM | POA: Insufficient documentation

## 2024-01-17 DIAGNOSIS — M79643 Pain in unspecified hand: Secondary | ICD-10-CM

## 2024-01-17 DIAGNOSIS — S29001A Unspecified injury of muscle and tendon of front wall of thorax, initial encounter: Secondary | ICD-10-CM | POA: Diagnosis present

## 2024-01-17 MED ORDER — HYDROCODONE-ACETAMINOPHEN 5-325 MG PO TABS
1.0000 | ORAL_TABLET | Freq: Once | ORAL | Status: AC
  Administered 2024-01-17: 1 via ORAL
  Filled 2024-01-17: qty 1

## 2024-01-17 MED ORDER — HYDROCODONE-ACETAMINOPHEN 5-325 MG PO TABS: 1.0000 | ORAL_TABLET | Freq: Four times a day (QID) | ORAL | 0 refills | Status: DC | PRN
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
## 2024-01-17 NOTE — Telephone Encounter (Signed)
 Asked to resend narcotic prescription filled today.  Apparently [facility]  phone line was down during the time it was sent.  This was confirmed by nursing staff.  Prescription resent.  I did not personally evaluate this patient.

## 2024-01-17 NOTE — Discharge Instructions (Addendum)
 Your x-ray showed no evidence of fracture in your wrist or hand however you have tenderness of the anatomic snuffbox which could indicate a scaphoid fracture.  We have placed you in a thumb spica splint and recommend you follow-up with hand surgery in 1 week's time.  Continue to do incentive spirometry at home.  Recommend Tylenol  and ibuprofen  for pain control in addition to Norco for breakthrough pain.  Can also try over-the-counter lidocaine patches.

## 2024-01-20 ENCOUNTER — Emergency Department (HOSPITAL_BASED_OUTPATIENT_CLINIC_OR_DEPARTMENT_OTHER): Admitting: Radiology

## 2024-01-20 ENCOUNTER — Other Ambulatory Visit: Payer: Self-pay

## 2024-01-20 ENCOUNTER — Emergency Department (HOSPITAL_BASED_OUTPATIENT_CLINIC_OR_DEPARTMENT_OTHER)
Admission: EM | Admit: 2024-01-20 | Discharge: 2024-01-20 | Disposition: A | Discharge status: Home / Self Care | Attending: Emergency Medicine | Admitting: Emergency Medicine

## 2024-01-20 ENCOUNTER — Encounter (HOSPITAL_BASED_OUTPATIENT_CLINIC_OR_DEPARTMENT_OTHER): Payer: Self-pay | Admitting: Emergency Medicine

## 2024-01-20 DIAGNOSIS — R0789 Other chest pain: Secondary | ICD-10-CM | POA: Diagnosis not present

## 2024-01-20 DIAGNOSIS — M79641 Pain in right hand: Secondary | ICD-10-CM | POA: Insufficient documentation

## 2024-01-20 DIAGNOSIS — W19XXXA Unspecified fall, initial encounter: Secondary | ICD-10-CM

## 2024-01-20 DIAGNOSIS — Y92410 Unspecified street and highway as the place of occurrence of the external cause: Secondary | ICD-10-CM | POA: Insufficient documentation

## 2024-01-20 DIAGNOSIS — M25531 Pain in right wrist: Secondary | ICD-10-CM | POA: Diagnosis not present

## 2024-01-20 DIAGNOSIS — R079 Chest pain, unspecified: Secondary | ICD-10-CM | POA: Diagnosis present

## 2024-01-20 MED ORDER — HYDROCODONE-ACETAMINOPHEN 5-325 MG PO TABS: 1.0000 | ORAL_TABLET | ORAL | 0 refills | Status: AC | PRN

## 2024-01-20 MED ORDER — HYDROMORPHONE HCL 1 MG/ML IJ SOLN
2.0000 mg | Freq: Once | INTRAMUSCULAR | Status: AC
  Administered 2024-01-20: 2 mg via INTRAMUSCULAR
  Filled 2024-01-20: qty 2
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
## 2024-01-20 NOTE — ED Notes (Signed)
 Patient transported to X-ray
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
## 2024-01-20 NOTE — Discharge Instructions (Addendum)
 Follow-up with the orthopedic surgeon in 1-2 weeks.  Keep the splint on until you see the orthopedist.  Otherwise, follow-up with your primary care provider.  If you develop new or worsening chest pain, trouble breathing, or any other new/concerning symptoms then return to the ER.

## 2024-01-26 ENCOUNTER — Other Ambulatory Visit (HOSPITAL_COMMUNITY): Payer: Self-pay | Admitting: Physical Medicine and Rehabilitation

## 2024-01-26 DIAGNOSIS — M5416 Radiculopathy, lumbar region: Secondary | ICD-10-CM

## 2024-02-13 ENCOUNTER — Encounter (HOSPITAL_COMMUNITY): Payer: Self-pay

## 2024-02-17 ENCOUNTER — Encounter (HOSPITAL_COMMUNITY): Payer: Self-pay

## 2024-02-17 ENCOUNTER — Ambulatory Visit (HOSPITAL_COMMUNITY)
Admission: RE | Admit: 2024-02-17 | Discharge: 2024-02-17 | Disposition: A | Discharge status: Home / Self Care | Source: Ambulatory Visit | Attending: Physical Medicine and Rehabilitation | Admitting: Physical Medicine and Rehabilitation

## 2024-02-17 DIAGNOSIS — M5416 Radiculopathy, lumbar region: Secondary | ICD-10-CM

## 2024-02-20 ENCOUNTER — Other Ambulatory Visit (HOSPITAL_COMMUNITY): Payer: Self-pay | Admitting: Physical Medicine and Rehabilitation

## 2024-02-20 DIAGNOSIS — M5416 Radiculopathy, lumbar region: Secondary | ICD-10-CM

## 2024-03-05 IMAGING — DX DG SHOULDER 2+V*L*
3 series · 3 of 3 positions shown · non-contrast
Comparison: None Available.

CLINICAL DATA: Lifting something and garden and felt arm pop
multiple times. Left biceps swollen with pain in left shoulder.
"Dent" in left bicep.

EXAM:
LEFT SHOULDER - 2+ VIEW; LEFT HUMERUS - 2+ VIEW

[shoulder grashey]
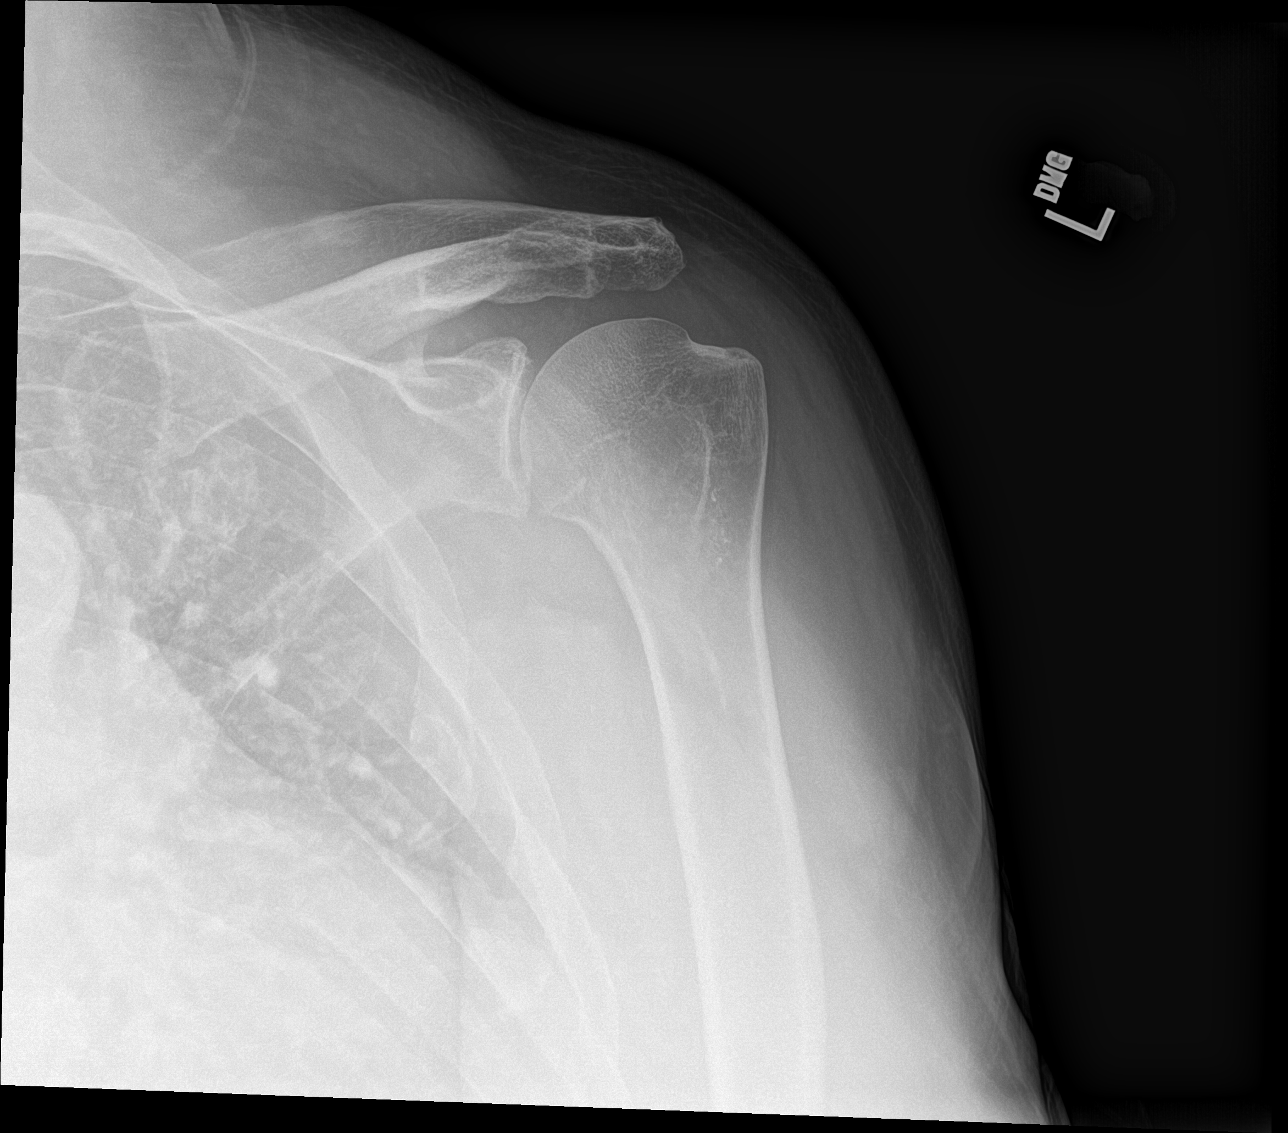

[shoulder y view]
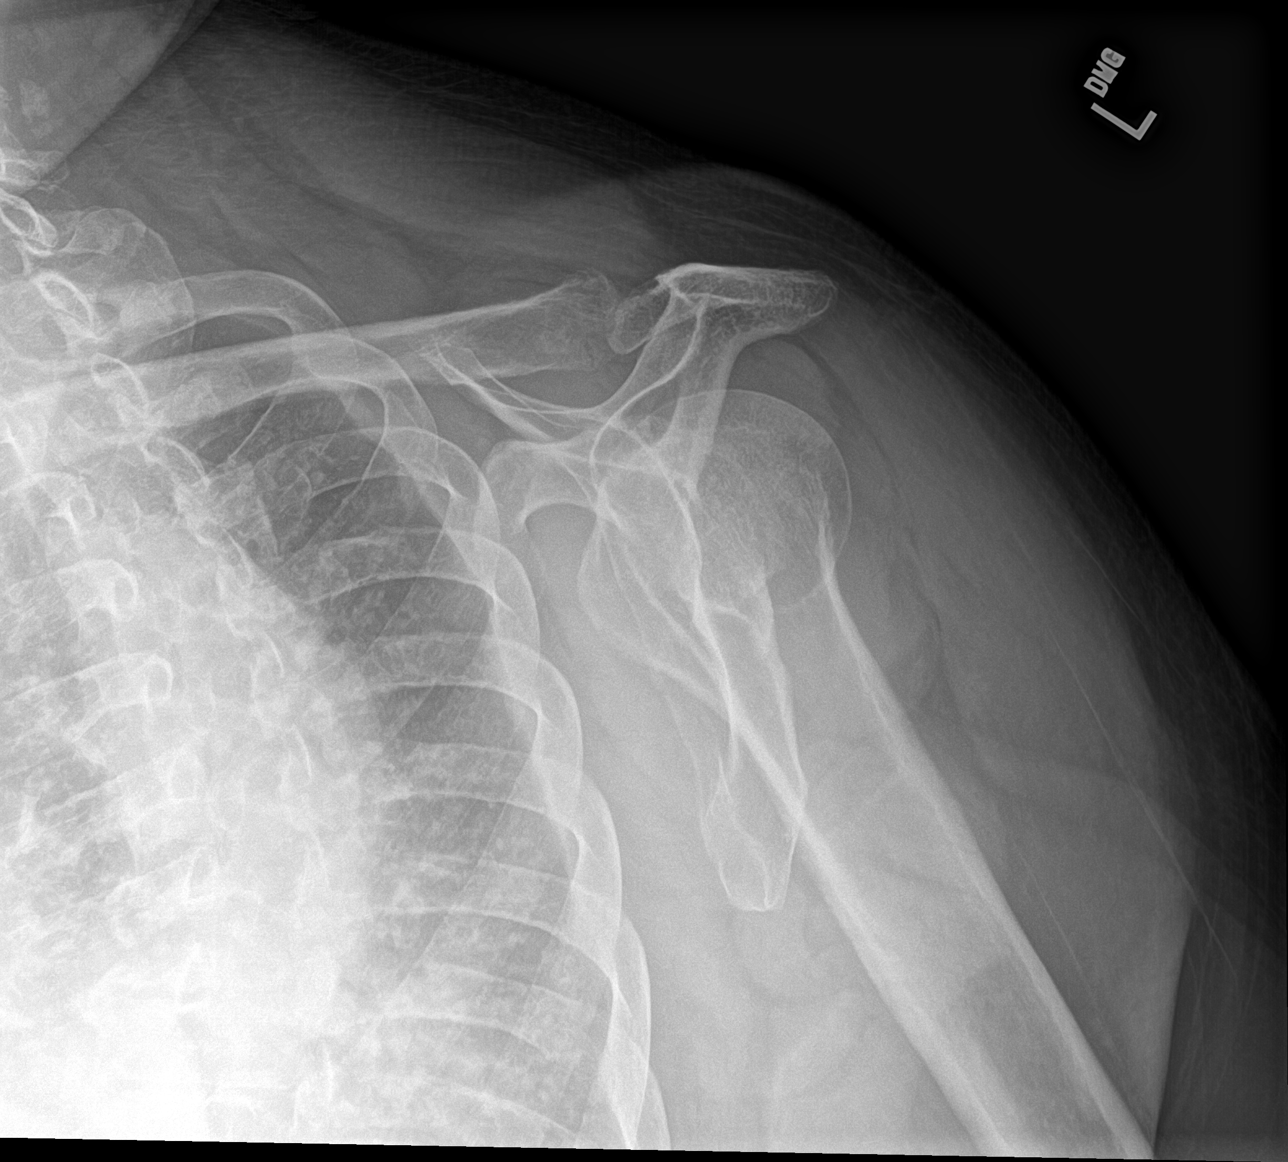

[shoulder axillary]
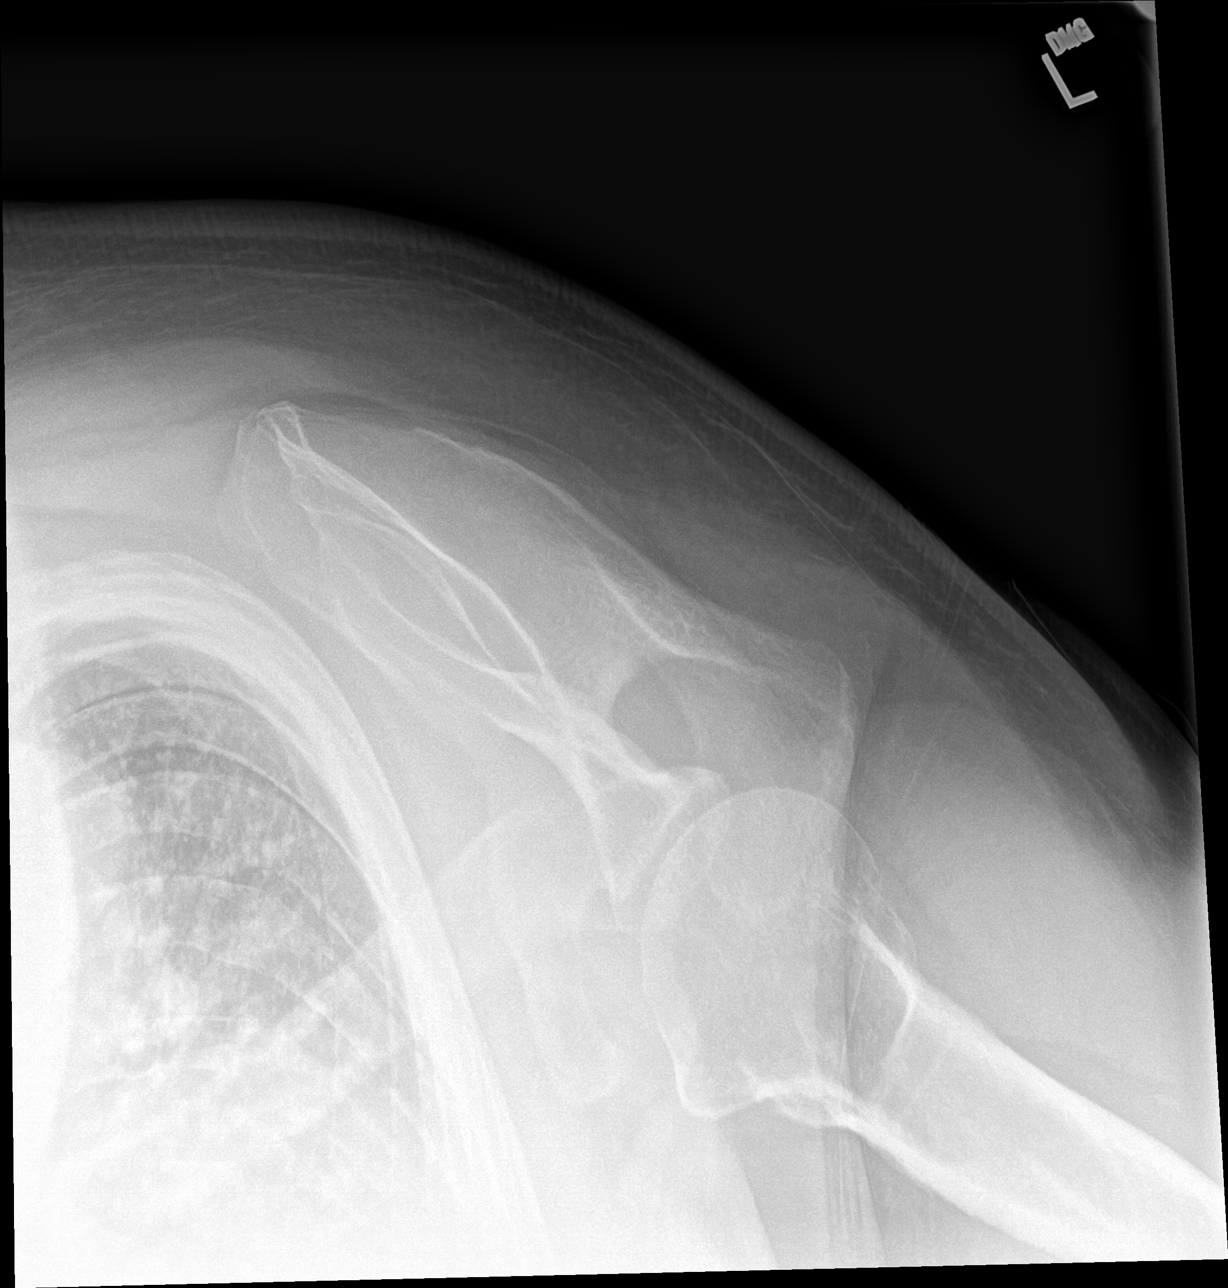

[3 of 3 positions shown; findings below may reference images not displayed]

FINDINGS: Left shoulder:

There is diffuse decreased bone mineralization. Moderate inferior
and mild superior glenoid joint space narrowing with mild inferior
degenerative osteophytosis. Mild to moderate acromioclavicular joint
space narrowing and peripheral osteophytosis. No acute fracture is
seen. No dislocation.

Left humerus:

In addition to the 3 shoulder images, a single view was provided of
the left humerus. No acute fracture is seen within the left humerus.
Normal alignment of the elbow joint with mild medial elbow joint
space narrowing.
IMPRESSION: 1. No acute fracture is seen within left shoulder or left humerus.
2. Mild to moderate acromioclavicular and glenohumeral
osteoarthritis.

## 2024-03-05 IMAGING — DX DG HUMERUS 2V *L*
1 series · 1 of 1 positions shown · non-contrast
Comparison: None Available.

CLINICAL DATA: Lifting something and garden and felt arm pop
multiple times. Left biceps swollen with pain in left shoulder.
"Dent" in left bicep.

EXAM:
LEFT SHOULDER - 2+ VIEW; LEFT HUMERUS - 2+ VIEW

[humerus ap]
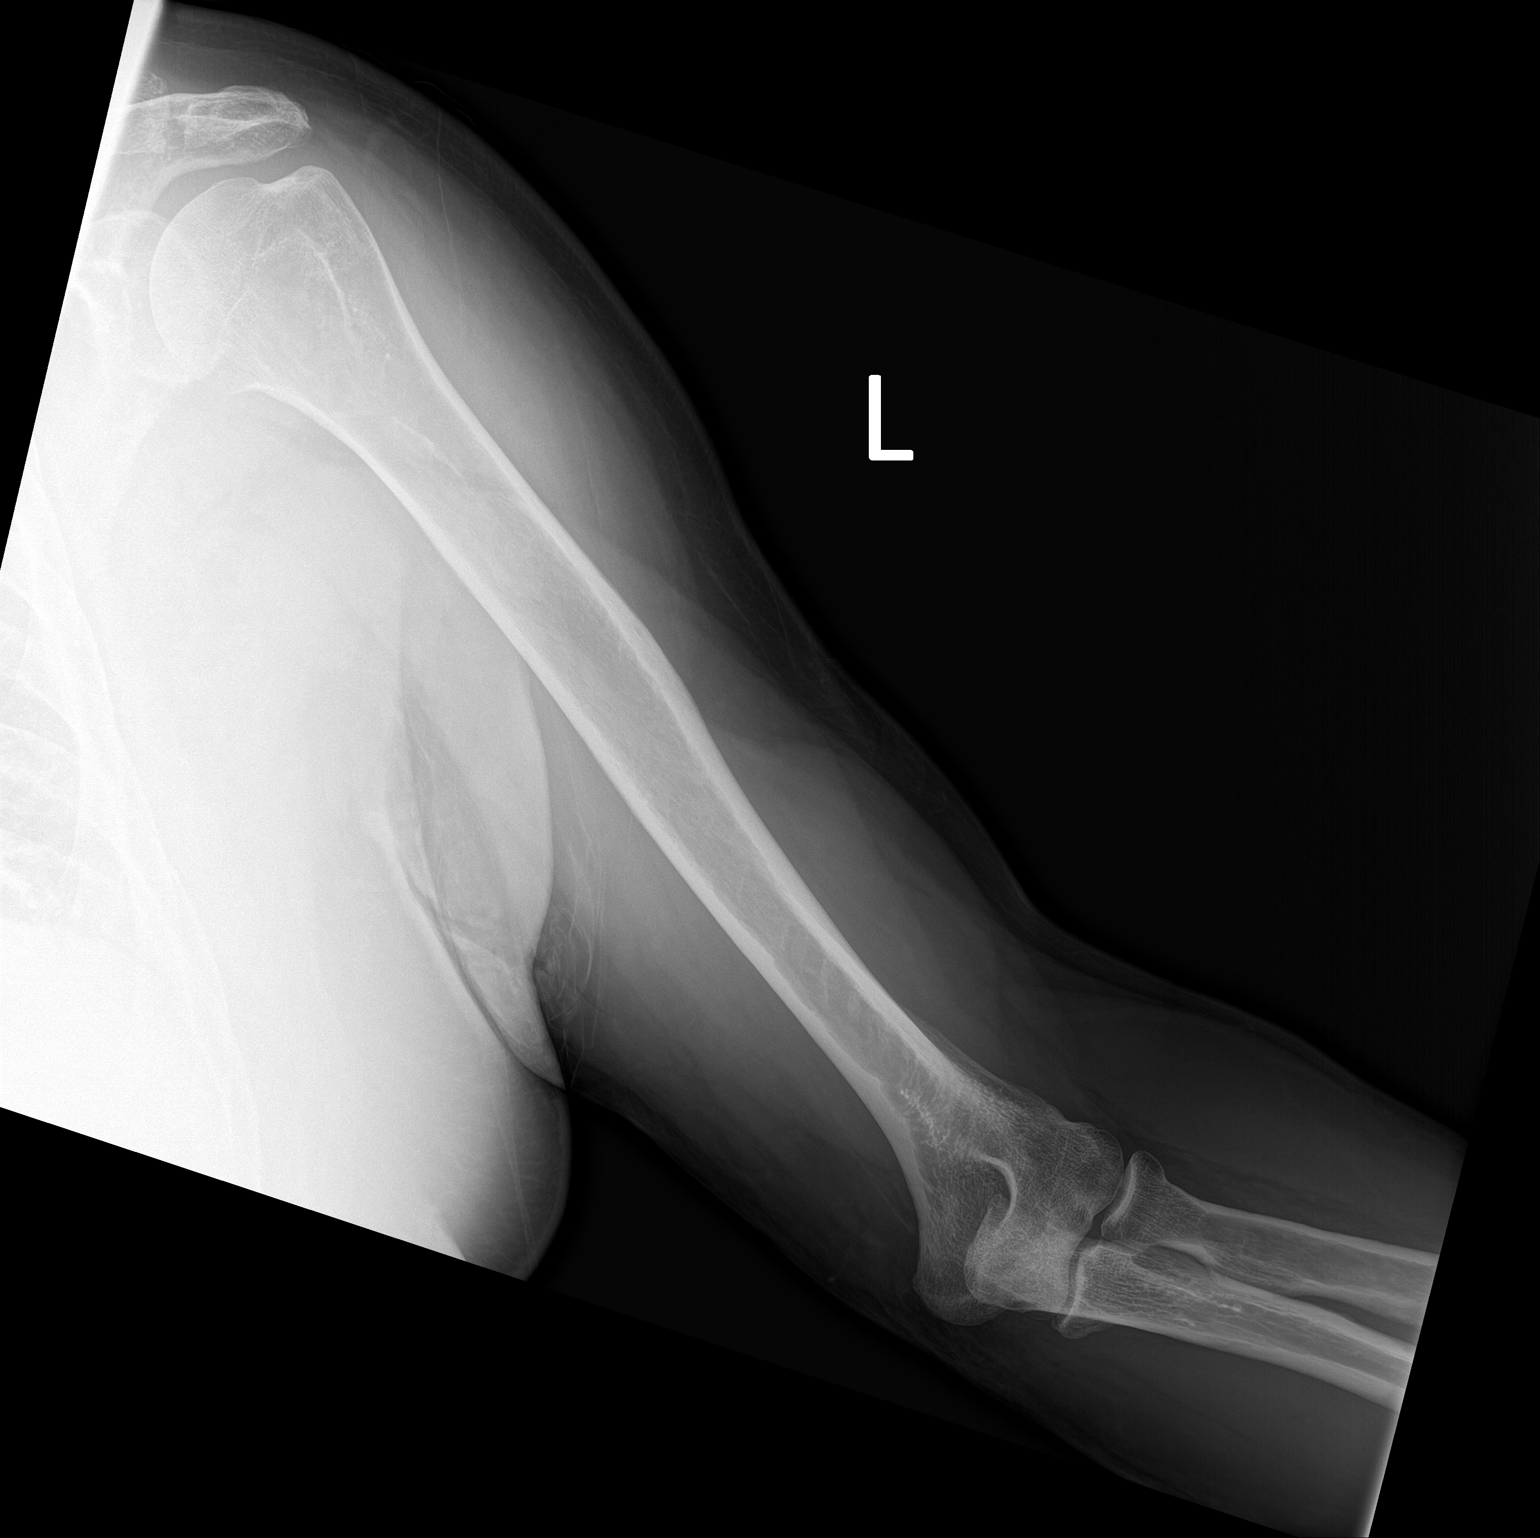

[1 of 1 positions shown; findings below may reference images not displayed]

FINDINGS: Left shoulder:

There is diffuse decreased bone mineralization. Moderate inferior
and mild superior glenoid joint space narrowing with mild inferior
degenerative osteophytosis. Mild to moderate acromioclavicular joint
space narrowing and peripheral osteophytosis. No acute fracture is
seen. No dislocation.

Left humerus:

In addition to the 3 shoulder images, a single view was provided of
the left humerus. No acute fracture is seen within the left humerus.
Normal alignment of the elbow joint with mild medial elbow joint
space narrowing.
IMPRESSION: 1. No acute fracture is seen within left shoulder or left humerus.
2. Mild to moderate acromioclavicular and glenohumeral
osteoarthritis.

## 2024-04-17 ENCOUNTER — Encounter (HOSPITAL_BASED_OUTPATIENT_CLINIC_OR_DEPARTMENT_OTHER): Payer: Self-pay | Admitting: Emergency Medicine

## 2024-04-17 ENCOUNTER — Other Ambulatory Visit: Payer: Self-pay

## 2024-04-17 ENCOUNTER — Inpatient Hospital Stay (HOSPITAL_BASED_OUTPATIENT_CLINIC_OR_DEPARTMENT_OTHER)
Admission: EM | Admit: 2024-04-17 | Discharge: 2024-04-24 | DRG: 286 | Disposition: A | Discharge status: Home / Self Care | Attending: Internal Medicine | Admitting: Internal Medicine

## 2024-04-17 ENCOUNTER — Emergency Department (HOSPITAL_BASED_OUTPATIENT_CLINIC_OR_DEPARTMENT_OTHER): Admitting: Radiology

## 2024-04-17 DIAGNOSIS — I5033 Acute on chronic diastolic (congestive) heart failure: Secondary | ICD-10-CM | POA: Diagnosis not present

## 2024-04-17 DIAGNOSIS — E1140 Type 2 diabetes mellitus with diabetic neuropathy, unspecified: Secondary | ICD-10-CM | POA: Diagnosis present

## 2024-04-17 DIAGNOSIS — Z881 Allergy status to other antibiotic agents status: Secondary | ICD-10-CM

## 2024-04-17 DIAGNOSIS — I5082 Biventricular heart failure: Secondary | ICD-10-CM | POA: Diagnosis present

## 2024-04-17 DIAGNOSIS — Z1152 Encounter for screening for COVID-19: Secondary | ICD-10-CM

## 2024-04-17 DIAGNOSIS — F17210 Nicotine dependence, cigarettes, uncomplicated: Secondary | ICD-10-CM | POA: Diagnosis present

## 2024-04-17 DIAGNOSIS — I130 Hypertensive heart and chronic kidney disease with heart failure and stage 1 through stage 4 chronic kidney disease, or unspecified chronic kidney disease: Principal | ICD-10-CM | POA: Diagnosis present

## 2024-04-17 DIAGNOSIS — Z794 Long term (current) use of insulin: Secondary | ICD-10-CM

## 2024-04-17 DIAGNOSIS — E66812 Obesity, class 2: Secondary | ICD-10-CM | POA: Diagnosis present

## 2024-04-17 DIAGNOSIS — I2510 Atherosclerotic heart disease of native coronary artery without angina pectoris: Secondary | ICD-10-CM | POA: Diagnosis present

## 2024-04-17 DIAGNOSIS — G4733 Obstructive sleep apnea (adult) (pediatric): Secondary | ICD-10-CM | POA: Diagnosis present

## 2024-04-17 DIAGNOSIS — Z91148 Patient's other noncompliance with medication regimen for other reason: Secondary | ICD-10-CM

## 2024-04-17 DIAGNOSIS — R0602 Shortness of breath: Principal | ICD-10-CM

## 2024-04-17 DIAGNOSIS — Z833 Family history of diabetes mellitus: Secondary | ICD-10-CM

## 2024-04-17 DIAGNOSIS — E7800 Pure hypercholesterolemia, unspecified: Secondary | ICD-10-CM | POA: Diagnosis present

## 2024-04-17 DIAGNOSIS — R000 Tachycardia, unspecified: Secondary | ICD-10-CM | POA: Diagnosis present

## 2024-04-17 DIAGNOSIS — Z7984 Long term (current) use of oral hypoglycemic drugs: Secondary | ICD-10-CM

## 2024-04-17 DIAGNOSIS — E1122 Type 2 diabetes mellitus with diabetic chronic kidney disease: Secondary | ICD-10-CM | POA: Diagnosis present

## 2024-04-17 DIAGNOSIS — Z7151 Drug abuse counseling and surveillance of drug abuser: Secondary | ICD-10-CM

## 2024-04-17 DIAGNOSIS — E1165 Type 2 diabetes mellitus with hyperglycemia: Secondary | ICD-10-CM | POA: Diagnosis present

## 2024-04-17 DIAGNOSIS — N179 Acute kidney failure, unspecified: Secondary | ICD-10-CM | POA: Diagnosis present

## 2024-04-17 DIAGNOSIS — Z6839 Body mass index (BMI) 39.0-39.9, adult: Secondary | ICD-10-CM

## 2024-04-17 DIAGNOSIS — I10 Essential (primary) hypertension: Secondary | ICD-10-CM | POA: Diagnosis present

## 2024-04-17 DIAGNOSIS — F40240 Claustrophobia: Secondary | ICD-10-CM | POA: Diagnosis present

## 2024-04-17 DIAGNOSIS — I509 Heart failure, unspecified: Secondary | ICD-10-CM

## 2024-04-17 DIAGNOSIS — Z713 Dietary counseling and surveillance: Secondary | ICD-10-CM

## 2024-04-17 DIAGNOSIS — F39 Unspecified mood [affective] disorder: Secondary | ICD-10-CM | POA: Diagnosis present

## 2024-04-17 DIAGNOSIS — E875 Hyperkalemia: Secondary | ICD-10-CM | POA: Diagnosis present

## 2024-04-17 DIAGNOSIS — Z6841 Body Mass Index (BMI) 40.0 and over, adult: Secondary | ICD-10-CM

## 2024-04-17 DIAGNOSIS — I5021 Acute systolic (congestive) heart failure: Principal | ICD-10-CM | POA: Diagnosis present

## 2024-04-17 DIAGNOSIS — I420 Dilated cardiomyopathy: Secondary | ICD-10-CM | POA: Diagnosis present

## 2024-04-17 DIAGNOSIS — Z79899 Other long term (current) drug therapy: Secondary | ICD-10-CM

## 2024-04-17 DIAGNOSIS — N183 Chronic kidney disease, stage 3 unspecified: Secondary | ICD-10-CM | POA: Diagnosis present

## 2024-04-17 DIAGNOSIS — F1410 Cocaine abuse, uncomplicated: Secondary | ICD-10-CM | POA: Diagnosis present

## 2024-04-17 LAB — BASIC METABOLIC PANEL WITH GFR
Anion gap: 7 (ref 5–15)
BUN: 11 mg/dL (ref 8–23)
CO2: 29 mmol/L (ref 22–32)
Calcium: 10 mg/dL (ref 8.9–10.3)
Chloride: 102 mmol/L (ref 98–111)
Creatinine, Ser: 1 mg/dL (ref 0.61–1.24)
GFR, Estimated: >60 mL/min (ref >60)
Glucose, Bld: 175 mg/dL — ABNORMAL HIGH (ref 70–99)
Potassium: 4.6 mmol/L (ref 3.5–5.1)
Sodium: 137 mmol/L (ref 135–145)

## 2024-04-17 LAB — CBC
HCT: 48.7 % (ref 39.0–52.0)
Hemoglobin: 15.6 g/dL (ref 13.0–17.0)
MCH: 29.7 pg (ref 26.0–34.0)
MCHC: 32 g/dL (ref 30.0–36.0)
MCV: 92.6 fL (ref 80.0–100.0)
Platelets: 227 K/uL (ref 150–400)
RBC: 5.26 MIL/uL (ref 4.22–5.81)
RDW: 12.5 % (ref 11.5–15.5)
WBC: 10.9 K/uL — ABNORMAL HIGH (ref 4.0–10.5)
nRBC: 0 % (ref 0.0–0.2)

## 2024-04-17 LAB — RESP PANEL BY RT-PCR (RSV, FLU A&B, COVID)  RVPGX2
Influenza A by PCR: NEGATIVE
Influenza B by PCR: NEGATIVE
Resp Syncytial Virus by PCR: NEGATIVE
SARS Coronavirus 2 by RT PCR: NEGATIVE

## 2024-04-17 LAB — GLUCOSE, CAPILLARY: Glucose-Capillary: 199 mg/dL — ABNORMAL HIGH (ref 70–99)

## 2024-04-17 LAB — PRO BRAIN NATRIURETIC PEPTIDE: Pro Brain Natriuretic Peptide: 613 pg/mL — ABNORMAL HIGH (ref <300.0)

## 2024-04-17 LAB — TROPONIN T, HIGH SENSITIVITY
Troponin T High Sensitivity: 44 ng/L — ABNORMAL HIGH (ref 0–19)
Troponin T High Sensitivity: 42 ng/L — ABNORMAL HIGH (ref 0–19)

## 2024-04-17 MED ORDER — ONDANSETRON HCL 4 MG/2ML IJ SOLN: 4.0000 mg | Freq: Four times a day (QID) | INTRAMUSCULAR | Status: DC | PRN

## 2024-04-17 MED ORDER — FUROSEMIDE 10 MG/ML IJ SOLN
40.0000 mg | Freq: Once | INTRAMUSCULAR | Status: AC
  Administered 2024-04-17: 40 mg via INTRAVENOUS
  Filled 2024-04-17: qty 4

## 2024-04-17 MED ORDER — ACETAMINOPHEN 325 MG PO TABS: 650.0000 mg | ORAL_TABLET | Freq: Four times a day (QID) | ORAL | Status: DC | PRN

## 2024-04-17 MED ORDER — HYDROCODONE-ACETAMINOPHEN 5-325 MG PO TABS
1.0000 | ORAL_TABLET | ORAL | Status: DC | PRN
  Administered 2024-04-18 – 2024-04-24 (×12): 2 via ORAL
  Filled 2024-04-17 (×12): qty 2

## 2024-04-17 MED ORDER — SODIUM CHLORIDE 0.9% FLUSH
3.0000 mL | Freq: Two times a day (BID) | INTRAVENOUS | Status: DC
  Administered 2024-04-18 – 2024-04-24 (×11): 3 mL via INTRAVENOUS

## 2024-04-17 MED ORDER — ASPIRIN 81 MG PO TBEC
81.0000 mg | DELAYED_RELEASE_TABLET | Freq: Every day | ORAL | Status: DC
  Administered 2024-04-18 – 2024-04-23 (×6): 81 mg via ORAL
  Filled 2024-04-17 (×6): qty 1

## 2024-04-17 MED ORDER — SODIUM CHLORIDE 0.9 % IV SOLN: 250.0000 mL | INTRAVENOUS | Status: AC | PRN

## 2024-04-17 MED ORDER — NICOTINE 14 MG/24HR TD PT24
14.0000 mg | MEDICATED_PATCH | Freq: Every day | TRANSDERMAL | Status: DC
  Filled 2024-04-17 (×7): qty 1

## 2024-04-17 MED ORDER — ALBUTEROL SULFATE (2.5 MG/3ML) 0.083% IN NEBU: 2.5000 mg | INHALATION_SOLUTION | RESPIRATORY_TRACT | Status: DC | PRN

## 2024-04-17 MED ORDER — ONDANSETRON HCL 4 MG PO TABS
4.0000 mg | ORAL_TABLET | Freq: Four times a day (QID) | ORAL | Status: DC | PRN
Start: 2024-04-17 — End: 2024-04-24

## 2024-04-17 MED ORDER — SODIUM CHLORIDE 0.9% FLUSH: 3.0000 mL | INTRAVENOUS | Status: DC | PRN

## 2024-04-17 MED ORDER — FUROSEMIDE 10 MG/ML IJ SOLN
40.0000 mg | Freq: Two times a day (BID) | INTRAMUSCULAR | Status: DC
  Administered 2024-04-18 – 2024-04-22 (×9): 40 mg via INTRAVENOUS
  Filled 2024-04-17 (×9): qty 4

## 2024-04-17 MED ORDER — GUAIFENESIN ER 600 MG PO TB12
600.0000 mg | ORAL_TABLET | Freq: Two times a day (BID) | ORAL | Status: DC
  Administered 2024-04-18 – 2024-04-23 (×9): 600 mg via ORAL
  Filled 2024-04-17 (×13): qty 1

## 2024-04-17 MED ORDER — IPRATROPIUM-ALBUTEROL 0.5-2.5 (3) MG/3ML IN SOLN
3.0000 mL | Freq: Once | RESPIRATORY_TRACT | Status: AC
  Administered 2024-04-17: 3 mL via RESPIRATORY_TRACT
  Filled 2024-04-17: qty 3

## 2024-04-17 MED ORDER — INSULIN ASPART 100 UNIT/ML IJ SOLN
0.0000 [IU] | Freq: Three times a day (TID) | INTRAMUSCULAR | Status: DC
  Administered 2024-04-18: 8 [IU] via SUBCUTANEOUS
  Administered 2024-04-18 – 2024-04-19 (×5): 3 [IU] via SUBCUTANEOUS
  Administered 2024-04-20: 5 [IU] via SUBCUTANEOUS
  Administered 2024-04-20: 2 [IU] via SUBCUTANEOUS
  Administered 2024-04-21 (×2): 3 [IU] via SUBCUTANEOUS
  Administered 2024-04-21: 8 [IU] via SUBCUTANEOUS
  Administered 2024-04-22: 3 [IU] via SUBCUTANEOUS
  Administered 2024-04-22: 5 [IU] via SUBCUTANEOUS
  Administered 2024-04-22: 3 [IU] via SUBCUTANEOUS
  Administered 2024-04-23: 5 [IU] via SUBCUTANEOUS
  Administered 2024-04-23 (×2): 2 [IU] via SUBCUTANEOUS
  Administered 2024-04-24: 5 [IU] via SUBCUTANEOUS
  Administered 2024-04-24: 3 [IU] via SUBCUTANEOUS
  Filled 2024-04-17: qty 2
  Filled 2024-04-17: qty 5
  Filled 2024-04-17: qty 3
  Filled 2024-04-17 (×2): qty 2
  Filled 2024-04-17: qty 8
  Filled 2024-04-17 (×2): qty 5
  Filled 2024-04-17: qty 3
  Filled 2024-04-17: qty 5
  Filled 2024-04-17 (×3): qty 3
  Filled 2024-04-17: qty 5
  Filled 2024-04-17 (×4): qty 3

## 2024-04-17 MED ORDER — ACETAMINOPHEN 650 MG RE SUPP: 650.0000 mg | Freq: Four times a day (QID) | RECTAL | Status: DC | PRN

## 2024-04-17 NOTE — ED Provider Notes (Signed)
 [place] EMERGENCY DEPARTMENT AT [facility] Provider Note   CSN: [id number] Arrival date & time: 04/17/24  9364     Patient presents with: Shortness of Breath and Cough   Charles Nunez is a 63 y.o. male.   Patient is a 63 year old male with a history of diabetes and hypertension who presents with shortness of breath.  His wife states that he has had a cough for the last couple months but over the last few days its gotten worse.  He has also had some recent nasal congestion and started having shortness of breath yesterday.  He denies any prior history of lung disease.  He does not use inhalers.  However he is a daily smoker.  He denies any increased leg swelling.  No associated chest discomfort.  His cough is mostly nonproductive but recently has brought up some phlegm.  No known fevers.  No nausea or vomiting.       Prior to Admission medications   Medication Sig Start Date End Date Taking? Authorizing Provider  acetaminophen  (TYLENOL ) 650 MG CR tablet Take 650 mg by mouth every 8 (eight) hours as needed for pain.   Yes [provider]  citalopram (CELEXA) 20 MG tablet Take 20 mg by mouth daily. 05/28/21  Yes [provider]  FARXIGA 10 MG TABS tablet Take 10 mg by mouth every morning. 06/24/21  Yes [provider]  glipiZIDE (GLUCOTROL XL) 10 MG 24 hr tablet Take 10 mg by mouth every morning. 06/24/21  Yes [provider]  lisinopril (ZESTRIL) 40 MG tablet Take 40 mg by mouth daily. 06/25/21  Yes [provider]  pregabalin (LYRICA) 100 MG capsule Take 100 mg by mouth 3 (three) times daily.   Yes [provider]  eszopiclone  (LUNESTA ) 2 MG TABS tablet Take 1 tablet (2 mg total) by mouth at bedtime as needed for sleep. Take immediately before bedtime Patient not taking: Reported on 04/17/2024 07/01/21   Neda Jennet LABOR, MD  HYDROcodone -acetaminophen  (NORCO/VICODIN) 5-325 MG tablet Take 1 tablet by mouth every 4 (four) hours  as needed. Patient not taking: Reported on 04/17/2024 01/20/24   Freddi Hamilton, MD  ibuprofen  (ADVIL ) 800 MG tablet Take 1 tablet (800 mg total) by mouth every 8 (eight) hours as needed. Patient not taking: Reported on 04/17/2024 11/09/21   Elnor Hila P, DO    Allergies: Vancomycin    Review of Systems  Constitutional:  Negative for chills, diaphoresis, fatigue and fever.  HENT:  Positive for congestion and rhinorrhea. Negative for sneezing.   Eyes: Negative.   Respiratory:  Positive for cough and shortness of breath. Negative for chest tightness.   Cardiovascular:  Negative for chest pain and leg swelling.  Gastrointestinal:  Negative for abdominal pain, diarrhea, nausea and vomiting.  Genitourinary:  Negative for difficulty urinating, flank pain and frequency.  Musculoskeletal:  Negative for arthralgias and back pain.  Skin:  Negative for rash.  Neurological:  Negative for dizziness, speech difficulty, weakness, numbness and headaches.    Updated Vital Signs BP (!) 145/101   Pulse (!) 111   Temp (!) 97.5 F (36.4 C) (Oral)   Resp 20   Wt 127 kg   SpO2 95%   BMI 40.17 kg/m   Physical Exam Constitutional:      Appearance: He is well-developed.  HENT:     Head: Normocephalic and atraumatic.  Eyes:     Pupils: Pupils are equal, round, and reactive to light.  Cardiovascular:  Rate and Rhythm: Normal rate and regular rhythm.     Heart sounds: Normal heart sounds.  Pulmonary:     Effort: Pulmonary effort is normal. No respiratory distress.     Breath sounds: Normal breath sounds.     Comments: Few fine crackles in the lungs with some mild expiratory wheezing, no increased work of breathing, mild tachypnea Chest:     Chest wall: No tenderness.  Abdominal:     General: Bowel sounds are normal.     Palpations: Abdomen is soft.     Tenderness: There is no abdominal tenderness. There is no guarding or rebound.  Musculoskeletal:        General: Normal range of motion.      Cervical back: Normal range of motion and neck supple.     Comments: No edema or calf tenderness  Lymphadenopathy:     Cervical: No cervical adenopathy.  Skin:    General: Skin is warm and dry.     Findings: No rash.  Neurological:     Mental Status: He is alert and oriented to person, place, and time.     (all labs ordered are listed, but only abnormal results are displayed) Labs Reviewed  BASIC METABOLIC PANEL WITH GFR - Abnormal; Notable for the following components:      Result Value   Glucose, Bld 175 (*)    All other components within normal limits  CBC - Abnormal; Notable for the following components:   WBC 10.9 (*)    All other components within normal limits  PRO BRAIN NATRIURETIC PEPTIDE - Abnormal; Notable for the following components:   Pro Brain Natriuretic Peptide 613.0 (*)    All other components within normal limits  TROPONIN T, HIGH SENSITIVITY - Abnormal; Notable for the following components:   Troponin T High Sensitivity 44 (*)    All other components within normal limits  TROPONIN T, HIGH SENSITIVITY - Abnormal; Notable for the following components:   Troponin T High Sensitivity 42 (*)    All other components within normal limits  RESP PANEL BY RT-PCR (RSV, FLU A&B, COVID)  [id number]    EKG: EKG Interpretation Date/Time:  Tuesday April 17 2024 07:17:56 EST Ventricular Rate:  107 PR Interval:  192 QRS Duration:  86 QT Interval:  352 QTC Calculation: 470 R Axis:   -3  Text Interpretation: Sinus tachycardia Nonspecific T abnrm, anterolateral leads Confirmed by Lenor Hollering [id number]) on 04/17/2024 9:24:03 AM  Radiology: [facility] Chest 2 View Result Date: 04/17/2024 EXAM: 2 VIEW(S) XRAY OF THE CHEST 04/17/2024 07:01:00 AM COMPARISON: None available. CLINICAL HISTORY: SOB SOB FINDINGS: LUNGS AND PLEURA: No focal pulmonary opacity. Pulmonary vascular congestion and mild edema. Trace pleural effusion is identified with thickening of the right minor fissure. No  pneumothorax. HEART AND MEDIASTINUM: Mild cardiac enlargement. BONES AND SOFT TISSUES: No acute osseous abnormality. IMPRESSION: 1. Pulmonary vascular congestion with mild interstitial pulmonary edema and trace pleural effusion. Correlate for signs or symptoms of congestive heart failure. Electronically signed by: Waddell Calk MD 04/17/2024 07:56 AM EST RP Workstation: [id number]     Procedures   Medications Ordered in the ED  ipratropium-albuterol (DUONEB) 0.5-2.5 (3) MG/3ML nebulizer solution 3 mL (3 mLs Nebulization Given 04/17/24 0758)  furosemide (LASIX) injection 40 mg (40 mg Intravenous Given 04/17/24 0936)  furosemide (LASIX) injection 40 mg (40 mg Intravenous Given 04/17/24 1503)  Medical Decision Making Amount and/or Complexity of Data Reviewed Labs: ordered. Radiology: ordered.  Risk Prescription drug management. Decision regarding hospitalization.   This patient presents to the ED for concern of shortness of breath, this involves an extensive number of treatment options, and is a complaint that carries with it a high risk of complications and morbidity.  I considered the following differential and admission for this acute, potentially life threatening condition.  The differential diagnosis includes asthma/COPD exacerbation, PE, CHF, pneumonia, pneumothorax  MDM:    Patient is a 63 year old who presents with shortness of breath.  He is a smoker but does not have a diagnosis of COPD and does not use inhalers.  He has some diminished breath sounds and few crackles on lung exam.  Initially tried a nebulizer treatment which did not significantly improve his symptoms.  Chest x-ray shows evidence of pulmonary edema.  His BNP is elevated.  His troponin is mildly elevated.  His EKG shows some T wave inversion laterally.  He does not have any chest pain.  Discussed with Dr. Jonel he will admit the patient for further treatment.  He was given a dose of  IV Lasix here in the ED.  Dr. Jonel requests a second dose to be given this afternoon as well as repeat blood work done in the morning if he still waiting for bed here and then ED.  These have been ordered.  (Labs, imaging, consults)  Labs: I Ordered, and personally interpreted labs.  The pertinent results include: Elevated BNP, mildly elevated troponin  Imaging Studies ordered: I ordered imaging studies including chest x-ray I independently visualized and interpreted imaging. I agree with the radiologist interpretation  Additional history obtained from wife at bedside.  External records from outside source obtained and reviewed including history  Cardiac Monitoring: The patient was maintained on a cardiac monitor.  If on the cardiac monitor, I personally viewed and interpreted the cardiac monitored which showed an underlying rhythm of: Sinus tachycardia  Reevaluation: After the interventions noted above, I reevaluated the patient and found that they have :improved  Social Determinants of Health:    Disposition: Admit to hospital  Co morbidities that complicate the patient evaluation  Past Medical History:  Diagnosis Date   Back pain    Diabetes mellitus without complication (HCC)      Medicines Meds ordered this encounter  Medications   ipratropium-albuterol (DUONEB) 0.5-2.5 (3) MG/3ML nebulizer solution 3 mL   furosemide (LASIX) injection 40 mg   furosemide (LASIX) injection 40 mg    I have reviewed the patients home medicines and have made adjustments as needed  Problem List / ED Course: Problem List Items Addressed This Visit   None            Final diagnoses:  None    ED Discharge Orders     None          Lenor Hollering, MD 04/17/24 1513

## 2024-04-17 NOTE — ED Triage Notes (Signed)
 Pt in ambulatory with c/o cough, sob and cold symptoms that began yesterday. Audible wheezes present, breathing labored on arrival.

## 2024-04-17 NOTE — H&P (Incomplete)
 Charles Nunez FMW:[id number] DOB: 13-Jun-1961 DOA: 04/17/2024     PCP: [facility] [address]    Patient arrived to ER on 04/17/24 at 0635 Referred by Attending Jonel Lonni SQUIBB, *   Patient coming from:    home Lives  With family    Chief Complaint:   Chief Complaint  Patient presents with  . Shortness of Breath  . Cough    HPI: Daily Crate is a 63 y.o. male with medical history significant of Dm2, HTN OSA, tobacco abuse    Presented with increased shortness of breath Pt with cough, SOB since yesterday wheezing and increased work of breathing He did report that he has been having some cough for few months but gotten worse yesterday.  Also having had some nasal congestion and increased shortness of breath He smokes daily now has been coughing up some phlegm no fevers no chills no nausea no vomiting In emergency department attempted nebulizer treatment but did not improved his symptoms significantly Chest x-ray showed pulmonary edema noted to have elevated BNP in 700s Given IV Lasix in the ER     Denies significant ETOH intake   Does  smoke  but interested in quitting    Regarding pertinent Chronic problems:    HTN on lisinopril       DM 2 -   on  PO meds only,    Morbid obesity-   BMI Readings from Last 1 Encounters:  04/17/24 40.49 kg/m    OSA - noncompliant with CPAP   While in ER:  No improvement after nebulizer treatment chest x-ray showed evidence of CHF Given a dose of Lasix      Lab Orders         Resp panel by RT-PCR (RSV, Flu A&B, Covid) Anterior Nasal Swab         Basic metabolic panel         CBC         Pro Brain natriuretic peptide         Basic metabolic panel         Glucose, capillary       CXR -  Pulmonary vascular congestion with mild interstitial pulmonary edema and trace pleural effusion.     Following Medications were ordered in ER: Medications  ipratropium-albuterol (DUONEB) 0.5-2.5 (3) MG/3ML nebulizer solution 3 mL (3 mLs  Nebulization Given 04/17/24 0758)  furosemide (LASIX) injection 40 mg (40 mg Intravenous Given 04/17/24 0936)  furosemide (LASIX) injection 40 mg (40 mg Intravenous Given 04/17/24 1503)     ED Triage Vitals  Encounter Vitals Group     BP 04/17/24 0642 (!) 148/98     Girls Systolic BP Percentile --      Girls Diastolic BP Percentile --      Boys Systolic BP Percentile --      Boys Diastolic BP Percentile --      Pulse Rate 04/17/24 0642 (!) 101     Resp 04/17/24 0642 (!) 28     Temp 04/17/24 0642 97.6 F (36.4 C)     Temp Source 04/17/24 0642 Oral     SpO2 04/17/24 0642 99 %     Weight 04/17/24 0642 279 lb 15.8 oz (127 kg)     Height 04/17/24 2139 5' 10 (1.778 m)     Head Circumference --      Peak Flow --      Pain Score 04/17/24 0642 0     Pain Loc --  Pain Education --      Exclude from Growth Chart --   [id number]     _________________________________________ Significant initial  Findings: Abnormal Labs Reviewed  BASIC METABOLIC PANEL WITH GFR - Abnormal; Notable for the following components:      Result Value   Glucose, Bld 175 (*)    All other components within normal limits  CBC - Abnormal; Notable for the following components:   WBC 10.9 (*)    All other components within normal limits  PRO BRAIN NATRIURETIC PEPTIDE - Abnormal; Notable for the following components:   Pro Brain Natriuretic Peptide 613.0 (*)    All other components within normal limits  GLUCOSE, CAPILLARY - Abnormal; Notable for the following components:   Glucose-Capillary 199 (*)    All other components within normal limits  TROPONIN T, HIGH SENSITIVITY - Abnormal; Notable for the following components:   Troponin T High Sensitivity 44 (*)    All other components within normal limits  TROPONIN T, HIGH SENSITIVITY - Abnormal; Notable for the following components:   Troponin T High Sensitivity 42 (*)    All other components within normal limits      _________________________ Troponin   ordered Trop T 44- 42     ECG: Ordered Personally reviewed and interpreted by me showing: HR : 107 Rhythm: Sinus tachycardia Nonspecific T abnrm, anterolateral leads QTC 470   The recent clinical data is shown below. Vitals:   04/17/24 1915 04/17/24 1930 04/17/24 2043 04/17/24 2139  BP: 128/72 (!) 150/93 (!) 132/111 (!) 132/111  Pulse: (!) 111 (!) 110 (!) 108 96  Resp: (!) 22 (!) 24 19 19   Temp:  98.6 F (37 C) 98.6 F (37 C) 98.6 F (37 C)  TempSrc:   Oral Oral  SpO2: 95% 99% 99% 99%  Weight:    128 kg  Height:    5' 10 (1.778 m)    WBC     Component Value Date/Time   WBC 10.9 (H) 04/17/2024 0712   LYMPHSABS 3.8 11/03/2020 1730   MONOABS 0.9 11/03/2020 1730   EOSABS 0.2 11/03/2020 1730   BASOSABS 0.1 11/03/2020 1730      Results for orders placed or performed during the hospital encounter of 04/17/24  Resp panel by RT-PCR (RSV, Flu A&B, Covid) Anterior Nasal Swab     Status: None   Collection Time: 04/17/24  7:29 AM   Specimen: Anterior Nasal Swab  Result Value Ref Range Status   SARS Coronavirus 2 by RT PCR NEGATIVE NEGATIVE Final         Influenza A by PCR NEGATIVE NEGATIVE Final   Influenza B by PCR NEGATIVE NEGATIVE Final         Resp Syncytial Virus by PCR NEGATIVE NEGATIVE Final          __________________________________________________________ Recent Labs  Lab 04/17/24 0712  NA 137  K 4.6  CO2 29  GLUCOSE 175*  BUN 11  CREATININE 1.00  CALCIUM 10.0    Cr   stable,   Lab Results  Component Value Date   CREATININE 1.00 04/17/2024   CREATININE 0.84 11/03/2020    No results for input(s): AST, ALT, ALKPHOS, BILITOT, PROT, ALBUMIN in the last 168 hours. Lab Results  Component Value Date   CALCIUM 10.0 04/17/2024        Plt: Lab Results  Component Value Date   PLT 227 04/17/2024    Recent Labs  Lab 04/17/24 0712  WBC 10.9*  HGB 15.6  HCT 48.7  MCV 92.6  PLT 227    HG/HCT  stable,      Component Value Date/Time    HGB 15.6 04/17/2024 0712   HCT 48.7 04/17/2024 0712   MCV 92.6 04/17/2024 0712    _______________________________________________ Hospitalist was called for admission for CHF exacerbation   The following Work up has been ordered so far:  Orders Placed This Encounter  Procedures  . Resp panel by RT-PCR (RSV, Flu A&B, Covid) Anterior Nasal Swab  . DG Chest 2 View  . Basic metabolic panel  . CBC  . Pro Brain natriuretic peptide  . Basic metabolic panel  . Glucose, capillary  . Document Height and Actual Weight  . If O2 Sat <94% administer O2 at 2 liters/minute via nasal cannula  . Cardiac Monitoring - Continuous Indefinite  . Consult to hospitalist  . ED EKG  . EKG 12-Lead  . EKG  . Place in observation (patient's expected length of stay will be less than 2 midnights)     OTHER Significant initial  Findings:  labs showing:     DM  labs:  HbA1C: No results for input(s): HGBA1C in the last 8760 hours.     CBG (last 3)  Recent Labs    04/17/24 2130  GLUCAP 199*          Cultures: No results found for: SDES, SPECREQUEST, CULT, REPTSTATUS   Radiological Exams on Admission: DG Chest 2 View Result Date: 04/17/2024 EXAM: 2 VIEW(S) XRAY OF THE CHEST 04/17/2024 07:01:00 AM COMPARISON: None available. CLINICAL HISTORY: SOB SOB FINDINGS: LUNGS AND PLEURA: No focal pulmonary opacity. Pulmonary vascular congestion and mild edema. Trace pleural effusion is identified with thickening of the right minor fissure. No pneumothorax. HEART AND MEDIASTINUM: Mild cardiac enlargement. BONES AND SOFT TISSUES: No acute osseous abnormality. IMPRESSION: 1. Pulmonary vascular congestion with mild interstitial pulmonary edema and trace pleural effusion. Correlate for signs or symptoms of congestive heart failure. Electronically signed by: Waddell Calk MD 04/17/2024 07:56 AM EST RP Workstation: [id number]    _______________________________________________________________________________________________________ Latest  Blood pressure (!) 132/111, pulse 96, temperature 98.6 F (37 C), temperature source Oral, resp. rate 19, height 5' 10 (1.778 m), weight 128 kg, SpO2 99%.   Vitals  labs and radiology finding personally reviewed  Review of Systems:    Pertinent positives include:  shortness of breath at rest.  dyspnea on exertion  Constitutional:  No weight loss, night sweats, Fevers, chills, fatigue, weight loss  HEENT:  No headaches, Difficulty swallowing,Tooth/dental problems,Sore throat,  No sneezing, itching, ear ache, nasal congestion, post nasal drip,  Cardio-vascular:  No chest pain, Orthopnea, PND, anasarca, dizziness, palpitations.no Bilateral lower extremity swelling  GI:  No heartburn, indigestion, abdominal pain, nausea, vomiting, diarrhea, change in bowel habits, loss of appetite, melena, blood in stool, hematemesis Resp:  no , No excess mucus, no productive cough, No non-productive cough, No coughing up of blood.No change in color of mucus.No wheezing. Skin:  no rash or lesions. No jaundice GU:  no dysuria, change in color of urine, no urgency or frequency. No straining to urinate.  No flank pain.  Musculoskeletal:  No joint pain or no joint swelling. No decreased range of motion. No back pain.  Psych:  No change in mood or affect. No depression or anxiety. No memory loss.  Neuro: no localizing neurological complaints, no tingling, no weakness, no double vision, no gait abnormality, no slurred speech, no confusion  All systems reviewed and apart from HOPI all are  negative _______________________________________________________________________________________________ Past Medical History:   Past Medical History:  Diagnosis Date  . Back pain   . Diabetes mellitus without complication [facility])       Past Surgical History:  Procedure Laterality Date  . BACK SURGERY       Social History:  Ambulatory   independently      reports that he has been smoking cigarettes. He started smoking about 45 years ago. He has a 11.5 pack-year smoking history. He has never used smokeless tobacco. He reports that he does not currently use drugs. He reports that he does not drink alcohol.    Family History:   Family History  Problem Relation Age of Onset  . Diabetes Mother   . Diabetes Father   . Alpha-1 antitrypsin deficiency Neg Hx   . Lung cancer Neg Hx    ______________________________________________________________________________________________ Allergies: Allergies  Allergen Reactions  . Vancomycin Other (See Comments)    Other reaction(s): Red Man Syndrome (ALLERGY), Redness Red man syndrome Per patient.  Patient denies anaphylaxis as mentioned in OSH records      Prior to Admission medications   Medication Sig Start Date End Date Taking? Authorizing Provider  acetaminophen  (TYLENOL ) 650 MG CR tablet Take 650 mg by mouth every 8 (eight) hours as needed for pain.   Yes [provider]  citalopram (CELEXA) 20 MG tablet Take 20 mg by mouth daily. 05/28/21  Yes [provider]  FARXIGA 10 MG TABS tablet Take 10 mg by mouth every morning. 06/24/21  Yes [provider]  glipiZIDE (GLUCOTROL XL) 10 MG 24 hr tablet Take 10 mg by mouth every morning. 06/24/21  Yes [provider]  lisinopril (ZESTRIL) 40 MG tablet Take 40 mg by mouth daily. 06/25/21  Yes [provider]  pregabalin (LYRICA) 100 MG capsule Take 100 mg by mouth 3 (three) times daily.   Yes [provider]  eszopiclone  (LUNESTA ) 2 MG TABS tablet Take 1 tablet (2 mg total) by mouth at bedtime as needed for sleep. Take immediately before bedtime Patient not taking: Reported on 04/17/2024 07/01/21   Neda Jennet LABOR, MD  HYDROcodone -acetaminophen  (NORCO/VICODIN) 5-325 MG tablet Take 1 tablet by mouth every 4 (four) hours as needed. Patient not  taking: Reported on 04/17/2024 01/20/24   Freddi Hamilton, MD  ibuprofen  (ADVIL ) 800 MG tablet Take 1 tablet (800 mg total) by mouth every 8 (eight) hours as needed. Patient not taking: Reported on 04/17/2024 11/09/21   Elnor Bernarda SQUIBB, DO    ___________________________________________________________________________________________________ Physical Exam:    04/17/2024    9:39 PM 04/17/2024    8:43 PM 04/17/2024    7:30 PM  Vitals with BMI  Height 5' 10    Weight 282 lbs 3 oz    BMI 40.49    Systolic 132 132 849  Diastolic 111 111 93  Pulse 96 891 110     1. General:  in No  Acute distress   Chronically ill   -appearing 2. Psychological: Alert and   Oriented 3. Head/ENT:    Dry Mucous Membranes                          Head Non traumatic, neck supple                     Poor Dentition 4. SKIN: norm  Skin turgor,  Skin clean Dry and intact no rash    5. Heart: Regular rate and  rhythm no*** Murmur, no Rub or gallop 6. Lungs: ***Clear to auscultation bilaterally, no wheezes or crackles   7. Abdomen: Soft, ***non-tender, Non distended *** obese ***bowel sounds present 8. Lower extremities: no clubbing, cyanosis, no ***edema 9. Neurologically Grossly intact, moving all 4 extremities equally   10. MSK: Normal range of motion    Chart has been reviewed  ______________________________________________________________________________________________  Assessment/Plan  63 y.o. male with medical history significant of Dm2, HTN OSA, tobacco abuse  Admitted for pulmonary edema   Present on Admission: . Acute on chronic diastolic CHF (congestive heart failure) (HCC)     No problem-specific Assessment & Plan notes found for this encounter.    Other plan as per orders.  DVT prophylaxis:  SCD       Code Status:    Code Status: Not on file FULL CODE  as per patient    I had personally discussed CODE STATUS with patient   ACP   none   Family Communication:   Family not at   Bedside    Diet diabetic heart healthy   Disposition Plan:       To home once workup is complete and patient is stable   Following barriers for discharge:                                                        Work of breathing improves       Consult Orders  (From admission, onward)           Start     Ordered   04/17/24 1016  Consult to hospitalist  Natay w/ cl called for consult  Once       Provider:  (Not yet assigned)  Question Answer Comment  Place call to: [facility]   Reason for Consult Admit      04/17/24 1015            Consults called:    NONE   Admission status:  ED Disposition     ED Disposition  Admit   Condition  --   Comment  Hospital Area: [facility] [[id number]]  Level of Care: Telemetry [5]  Interfacility transfer: Yes  Diagnosis: Acute on chronic diastolic CHF (congestive heart failure) [facility]) [[id number]]  Admitting Physician: JONEL LONNI SQUIBB [[id number]]  Attending Physician: JONEL LONNI SQUIBB [[id number]]           Obs     Level of care     tele  For  24H   Rhyen Mazariego 04/17/2024, 11:56 PM    [facility]     after 2 AM please page floor coverage   If 7AM-7PM, please contact the day team taking care of the patient using [email or web address]

## 2024-04-17 NOTE — ED Notes (Signed)
Lauren with cl called for transport 

## 2024-04-17 NOTE — Care Plan (Signed)
 Hospitalist transfer accept note: 63 yo M with MO, HTN, OSA, DM, presented with shortness of breath.  CXR with edema.  Troponin minimally elevated, ECG normal.  Breathing still 24/min and labored.  Given Lasix.  Accepted to med tele.  Patient will remain under the care of Dr. Lenor while at [facility].  She acknowledged that she will repeat Lasix this evening if he remains at [facility] and repeat BMP in AM.

## 2024-04-17 NOTE — ED Notes (Signed)
 Respiratory assessed in triage

## 2024-04-17 NOTE — Assessment & Plan Note (Signed)
-   Pt diagnosed with CHF based on presence of the following:  cardiomegaly, Pulmonary edema on CXR, and    bilateral leg edema, DOE,  With noted response to IV diuretic in ER  admit on telemetry,  cycle cardiac enzymes,   obtain serial ECG  to evaluate for ischemia as a cause of heart failure  monitor daily weight:  Filed Weights   04/17/24 0642 04/17/24 2139  Weight: 127 kg 128 kg       diurese with IV lasix   40 mg IV BID    and monitor orthostatics and creatinine to avoid over diuresis.  Order echogram to evaluate EF and valves     cardiology consult if Abnormal echo

## 2024-04-17 NOTE — Subjective & Objective (Signed)
 Pt with cough, SOB since yesterday wheezing and increased work of breathing He did report that he has been having some cough for few months but gotten worse yesterday.  Also having had some nasal congestion and increased shortness of breath He smokes daily now has been coughing up some phlegm no fevers no chills no nausea no vomiting In emergency department attempted nebulizer treatment but did not improved his symptoms significantly Chest x-ray showed pulmonary edema noted to have elevated BNP in 700s Given IV Lasix in the ER

## 2024-04-17 NOTE — H&P (Signed)
 Charles Nunez FMW:[id number] DOB: 1961/04/26 DOA: 04/17/2024     PCP: [facility] [address]    Patient arrived to ER on 04/17/24 at 0635 Referred by Attending Jonel Lonni SQUIBB, *   Patient coming from:    home Lives  With family    Chief Complaint:   Chief Complaint  Patient presents with   Shortness of Breath   Cough    HPI: Charles Nunez is a 63 y.o. male with medical history significant of Dm2, HTN OSA, tobacco abuse    Presented with increased shortness of breath Pt with cough, SOB since yesterday wheezing and increased work of breathing He did report that he has been having some cough for few months but gotten worse yesterday.  Also having had some nasal congestion and increased shortness of breath He smokes daily now has been coughing up some phlegm no fevers no chills no nausea no vomiting In emergency department attempted nebulizer treatment but did not improved his symptoms significantly Chest x-ray showed pulmonary edema noted to have elevated BNP in 700s Given IV Lasix in the ER    Denies any Chest pain no fever, SOB has resolved  No recent travel no leg edema  Denies significant ETOH intake   Does  smoke  but interested in quitting    Regarding pertinent Chronic problems:    HTN on lisinopril       DM 2 -   on  PO meds only,    Morbid obesity-   BMI Readings from Last 1 Encounters:  04/17/24 40.49 kg/m    OSA - noncompliant with CPAP   While in ER:  No improvement after nebulizer treatment chest x-ray showed evidence of CHF Given a dose of Lasix      Lab Orders         Resp panel by RT-PCR (RSV, Flu A&B, Covid) Anterior Nasal Swab         Basic metabolic panel         CBC         Pro Brain natriuretic peptide         Basic metabolic panel         Glucose, capillary       CXR -  Pulmonary vascular congestion with mild interstitial pulmonary edema and trace pleural effusion.     Following Medications were ordered in ER: Medications   ipratropium-albuterol (DUONEB) 0.5-2.5 (3) MG/3ML nebulizer solution 3 mL (3 mLs Nebulization Given 04/17/24 0758)  furosemide (LASIX) injection 40 mg (40 mg Intravenous Given 04/17/24 0936)  furosemide (LASIX) injection 40 mg (40 mg Intravenous Given 04/17/24 1503)     ED Triage Vitals  Encounter Vitals Group     BP 04/17/24 0642 (!) 148/98     Girls Systolic BP Percentile --      Girls Diastolic BP Percentile --      Boys Systolic BP Percentile --      Boys Diastolic BP Percentile --      Pulse Rate 04/17/24 0642 (!) 101     Resp 04/17/24 0642 (!) 28     Temp 04/17/24 0642 97.6 F (36.4 C)     Temp Source 04/17/24 0642 Oral     SpO2 04/17/24 0642 99 %     Weight 04/17/24 0642 279 lb 15.8 oz (127 kg)     Height 04/17/24 2139 5' 10 (1.778 m)     Head Circumference --      Peak Flow --  Pain Score 04/17/24 0642 0     Pain Loc --      Pain Education --      Exclude from Growth Chart --   UFJK(75)@     _________________________________________ Significant initial  Findings: Abnormal Labs Reviewed  BASIC METABOLIC PANEL WITH GFR - Abnormal; Notable for the following components:      Result Value   Glucose, Bld 175 (*)    All other components within normal limits  CBC - Abnormal; Notable for the following components:   WBC 10.9 (*)    All other components within normal limits  PRO BRAIN NATRIURETIC PEPTIDE - Abnormal; Notable for the following components:   Pro Brain Natriuretic Peptide 613.0 (*)    All other components within normal limits  GLUCOSE, CAPILLARY - Abnormal; Notable for the following components:   Glucose-Capillary 199 (*)    All other components within normal limits  TROPONIN T, HIGH SENSITIVITY - Abnormal; Notable for the following components:   Troponin T High Sensitivity 44 (*)    All other components within normal limits  TROPONIN T, HIGH SENSITIVITY - Abnormal; Notable for the following components:   Troponin T High Sensitivity 42 (*)    All other  components within normal limits      _________________________ Troponin  ordered Trop T 44- 42     ECG: Ordered Personally reviewed and interpreted by me showing: HR : 107 Rhythm: Sinus tachycardia Nonspecific T abnrm, anterolateral leads QTC 470   The recent clinical data is shown below. Vitals:   04/17/24 2043 04/17/24 2139 04/17/24 2344 04/18/24 0029  BP: (!) 132/111 (!) 132/111  (!) 138/119  Pulse: (!) 108 96  (!) 107  Resp: 19 19  19   Temp: 98.6 F (37 C) 98.6 F (37 C)  98.7 F (37.1 C)  TempSrc: Oral Oral  Oral  SpO2: 99% 99% 95% 95%  Weight:  128 kg    Height:  5' 10 (1.778 m)      WBC     Component Value Date/Time   WBC 10.4 04/18/2024 0044   LYMPHSABS 3.4 04/18/2024 0044   MONOABS 0.7 04/18/2024 0044   EOSABS 0.2 04/18/2024 0044   BASOSABS 0.0 04/18/2024 0044      Results for orders placed or performed during the hospital encounter of 04/17/24  Resp panel by RT-PCR (RSV, Flu A&B, Covid) Anterior Nasal Swab     Status: None   Collection Time: 04/17/24  7:29 AM   Specimen: Anterior Nasal Swab  Result Value Ref Range Status   SARS Coronavirus 2 by RT PCR NEGATIVE NEGATIVE Final         Influenza A by PCR NEGATIVE NEGATIVE Final   Influenza B by PCR NEGATIVE NEGATIVE Final         Resp Syncytial Virus by PCR NEGATIVE NEGATIVE Final          __________________________________________________________ Recent Labs  Lab 04/17/24 0712 04/18/24 0044  NA 137 136  K 4.6 4.2  CO2 29 30  GLUCOSE 175* 205*  BUN 11 12  CREATININE 1.00 1.15  CALCIUM 10.0 9.7  MG  --  1.7  PHOS  --  2.3*    Cr   stable,   Lab Results  Component Value Date   CREATININE 1.15 04/18/2024   CREATININE 1.00 04/17/2024   CREATININE 0.84 11/03/2020    Recent Labs  Lab 04/18/24 0044  AST 27  ALT 23  ALKPHOS 95  BILITOT 0.7  PROT 6.5  ALBUMIN 3.3*   Lab Results  Component Value Date   CALCIUM 10.0 04/17/2024        Plt: Lab Results  Component Value Date    PLT 227 04/17/2024    Recent Labs  Lab 04/17/24 0712  WBC 10.9*  HGB 15.6  HCT 48.7  MCV 92.6  PLT 227    HG/HCT  stable,      Component Value Date/Time   HGB 15.6 04/17/2024 0712   HCT 48.7 04/17/2024 0712   MCV 92.6 04/17/2024 0712    _______________________________________________ Hospitalist was called for admission for CHF exacerbation   The following Work up has been ordered so far:  Orders Placed This Encounter  Procedures   Resp panel by RT-PCR (RSV, Flu A&B, Covid) Anterior Nasal Swab   DG Chest 2 View   Basic metabolic panel   CBC   Pro Brain natriuretic peptide   Basic metabolic panel   Glucose, capillary   Document Height and Actual Weight   If O2 Sat <94% administer O2 at 2 liters/minute via nasal cannula   Cardiac Monitoring - Continuous Indefinite   Consult to hospitalist   ED EKG   EKG 12-Lead   EKG   Place in observation (patient's expected length of stay will be less than 2 midnights)     OTHER Significant initial  Findings:  labs showing:     DM  labs:  HbA1C: No results for input(s): HGBA1C in the last 8760 hours.     CBG (last 3)  Recent Labs    04/17/24 2130  GLUCAP 199*          Cultures: No results found for: SDES, SPECREQUEST, CULT, REPTSTATUS   Radiological Exams on Admission: DG Chest 2 View Result Date: 04/17/2024 EXAM: 2 VIEW(S) XRAY OF THE CHEST 04/17/2024 07:01:00 AM COMPARISON: None available. CLINICAL HISTORY: SOB SOB FINDINGS: LUNGS AND PLEURA: No focal pulmonary opacity. Pulmonary vascular congestion and mild edema. Trace pleural effusion is identified with thickening of the right minor fissure. No pneumothorax. HEART AND MEDIASTINUM: Mild cardiac enlargement. BONES AND SOFT TISSUES: No acute osseous abnormality. IMPRESSION: 1. Pulmonary vascular congestion with mild interstitial pulmonary edema and trace pleural effusion. Correlate for signs or symptoms of congestive heart failure. Electronically signed  by: Waddell Calk MD 04/17/2024 07:56 AM EST RP Workstation: [id number]   _______________________________________________________________________________________________________ Latest  Blood pressure (!) 132/111, pulse 96, temperature 98.6 F (37 C), temperature source Oral, resp. rate 19, height 5' 10 (1.778 m), weight 128 kg, SpO2 99%.   Vitals  labs and radiology finding personally reviewed  Review of Systems:    Pertinent positives include:  shortness of breath at rest.  dyspnea on exertion  Constitutional:  No weight loss, night sweats, Fevers, chills, fatigue, weight loss  HEENT:  No headaches, Difficulty swallowing,Tooth/dental problems,Sore throat,  No sneezing, itching, ear ache, nasal congestion, post nasal drip,  Cardio-vascular:  No chest pain, Orthopnea, PND, anasarca, dizziness, palpitations.no Bilateral lower extremity swelling  GI:  No heartburn, indigestion, abdominal pain, nausea, vomiting, diarrhea, change in bowel habits, loss of appetite, melena, blood in stool, hematemesis Resp:  no , No excess mucus, no productive cough, No non-productive cough, No coughing up of blood.No change in color of mucus.No wheezing. Skin:  no rash or lesions. No jaundice GU:  no dysuria, change in color of urine, no urgency or frequency. No straining to urinate.  No flank pain.  Musculoskeletal:  No joint pain or no joint swelling. No decreased range of motion.  No back pain.  Psych:  No change in mood or affect. No depression or anxiety. No memory loss.  Neuro: no localizing neurological complaints, no tingling, no weakness, no double vision, no gait abnormality, no slurred speech, no confusion  All systems reviewed and apart from HOPI all are negative _______________________________________________________________________________________________ Past Medical History:   Past Medical History:  Diagnosis Date   Back pain    Diabetes mellitus without complication (HCC)        Past Surgical History:  Procedure Laterality Date   BACK SURGERY      Social History:  Ambulatory   independently      reports that he has been smoking cigarettes. He started smoking about 45 years ago. He has a 11.5 pack-year smoking history. He has never used smokeless tobacco. He reports that he does not currently use drugs. He reports that he does not drink alcohol.    Family History:   Family History  Problem Relation Age of Onset   Diabetes Mother    Diabetes Father    Alpha-1 antitrypsin deficiency Neg Hx    Lung cancer Neg Hx    ______________________________________________________________________________________________ Allergies: Allergies  Allergen Reactions   Vancomycin Other (See Comments)    Other reaction(s): Red Man Syndrome (ALLERGY), Redness Red man syndrome Per patient.  Patient denies anaphylaxis as mentioned in OSH records      Prior to Admission medications   Medication Sig Start Date End Date Taking? Authorizing Provider  acetaminophen  (TYLENOL ) 650 MG CR tablet Take 650 mg by mouth every 8 (eight) hours as needed for pain.   Yes [provider]  citalopram (CELEXA) 20 MG tablet Take 20 mg by mouth daily. 05/28/21  Yes [provider]  FARXIGA 10 MG TABS tablet Take 10 mg by mouth every morning. 06/24/21  Yes [provider]  glipiZIDE (GLUCOTROL XL) 10 MG 24 hr tablet Take 10 mg by mouth every morning. 06/24/21  Yes [provider]  lisinopril (ZESTRIL) 40 MG tablet Take 40 mg by mouth daily. 06/25/21  Yes [provider]  pregabalin (LYRICA) 100 MG capsule Take 100 mg by mouth 3 (three) times daily.   Yes [provider]  eszopiclone  (LUNESTA ) 2 MG TABS tablet Take 1 tablet (2 mg total) by mouth at bedtime as needed for sleep. Take immediately before bedtime Patient not taking: Reported on 04/17/2024 07/01/21   Neda Jennet LABOR, MD  HYDROcodone -acetaminophen  (NORCO/VICODIN) 5-325 MG tablet Take  1 tablet by mouth every 4 (four) hours as needed. Patient not taking: Reported on 04/17/2024 01/20/24   Freddi Hamilton, MD  ibuprofen  (ADVIL ) 800 MG tablet Take 1 tablet (800 mg total) by mouth every 8 (eight) hours as needed. Patient not taking: Reported on 04/17/2024 11/09/21   Elnor Bernarda SQUIBB, DO    ___________________________________________________________________________________________________ Physical Exam:    04/17/2024    9:39 PM 04/17/2024    8:43 PM 04/17/2024    7:30 PM  Vitals with BMI  Height 5' 10    Weight 282 lbs 3 oz    BMI 40.49    Systolic 132 132 849  Diastolic 111 111 93  Pulse 96 891 110     1. General:  in No  Acute distress   Chronically ill   -appearing 2. Psychological: Alert and   Oriented 3. Head/ENT:    Dry Mucous Membranes  Head Non traumatic, neck supple                     Poor Dentition 4. SKIN: norm  Skin turgor,  Skin clean Dry and intact no rash    5. Heart: rapid Regular rate and rhythm no  Murmur, no Rub or gallop 6. Lungs:    no wheezes or crackles   7. Abdomen: Soft,  non-tender, Non distended   obese  bowel sounds present 8. Lower extremities: no clubbing, cyanosis, no  edema 9. Neurologically Grossly intact, moving all 4 extremities equally   10. MSK: Normal range of motion    Chart has been reviewed  ______________________________________________________________________________________________  Assessment/Plan  63 y.o. male with medical history significant of Dm2, HTN OSA, tobacco abuse  Admitted for pulmonary edema   Present on Admission:  Acute on chronic diastolic CHF (congestive heart failure) (HCC)  Essential hypertension  Hypercholesterolemia  OSA (obstructive sleep apnea)    Acute on chronic diastolic CHF (congestive heart failure) (HCC) - Pt diagnosed with CHF based on presence of the following:  cardiomegaly, Pulmonary edema on CXR, and    bilateral leg edema, DOE,  With noted response to IV  diuretic in ER  admit on telemetry,  cycle cardiac enzymes,   obtain serial ECG  to evaluate for ischemia as a cause of heart failure  monitor daily weight:  Filed Weights   04/17/24 0642 04/17/24 2139  Weight: 127 kg 128 kg       diurese with IV lasix   40 mg IV BID    and monitor orthostatics and creatinine to avoid over diuresis.  Order echogram to evaluate EF and valves     cardiology consult if Abnormal echo   Essential hypertension Restart lisinopril if renal function remained stable  Hypercholesterolemia Patient currently not taking any statins  Morbid obesity with BMI of 40.0-44.9, adult (HCC) Contributing to comorbidity and complicating medical management  Body mass index is 40.49 kg/m.  Nutritional follow up as an out pt would be recommended\  OSA (obstructive sleep apnea) Not compliant with CPAP  Uncontrolled type 2 diabetes mellitus with hyperglycemia, with long-term current use of insulin  (HCC)  - Order Sensitive  SSI    -  check TSH and HgA1C  - Hold by mouth medications    Tachycardia Appears to be in sinus tachycardia Check TSH  Echo  Check d.dimer   Other plan as per orders.  DVT prophylaxis:  SCD       Code Status:    Code Status: Not on file FULL CODE  as per patient    I had personally discussed CODE STATUS with patient   ACP   none   Family Communication:   Family not at  Bedside    Diet diabetic heart healthy   Disposition Plan:       To home once workup is complete and patient is stable   Following barriers for discharge:                                                        Work of breathing improves       Consult Orders  (From admission, onward)           Start     Ordered   04/17/24 1016  Consult to hospitalist  Natay w/ cl called for consult  Once       Provider:  (Not yet assigned)  Question Answer Comment  Place call to: [facility]   Reason for Consult Admit      04/17/24 1015             Consults called:    NONE   Admission status:  ED Disposition     ED Disposition  Admit   Condition  --   Comment  Hospital Area: [facility] [[id number]]  Level of Care: Telemetry [5]  Interfacility transfer: Yes  Diagnosis: Acute on chronic diastolic CHF (congestive heart failure) [facility]) [[id number]]  Admitting Physician: JONEL LONNI SQUIBB [[id number]]  Attending Physician: JONEL LONNI SQUIBB [[id number]]           Obs     Level of care     tele  For  24H   Mercadez Heitman 04/18/2024, 2:13 AM    [facility]     after 2 AM please page floor coverage   If 7AM-7PM, please contact the day team taking care of the patient using [email or web address]

## 2024-04-17 NOTE — ED Notes (Signed)
 Pt out of ED with carelink, not in visible distress.

## 2024-04-18 ENCOUNTER — Observation Stay (HOSPITAL_COMMUNITY)

## 2024-04-18 DIAGNOSIS — Z79899 Other long term (current) drug therapy: Secondary | ICD-10-CM | POA: Diagnosis not present

## 2024-04-18 DIAGNOSIS — Z881 Allergy status to other antibiotic agents status: Secondary | ICD-10-CM | POA: Diagnosis not present

## 2024-04-18 DIAGNOSIS — G4733 Obstructive sleep apnea (adult) (pediatric): Secondary | ICD-10-CM | POA: Diagnosis present

## 2024-04-18 DIAGNOSIS — R000 Tachycardia, unspecified: Secondary | ICD-10-CM | POA: Diagnosis present

## 2024-04-18 DIAGNOSIS — N183 Chronic kidney disease, stage 3 unspecified: Secondary | ICD-10-CM | POA: Diagnosis present

## 2024-04-18 DIAGNOSIS — I509 Heart failure, unspecified: Secondary | ICD-10-CM | POA: Diagnosis not present

## 2024-04-18 DIAGNOSIS — F1410 Cocaine abuse, uncomplicated: Secondary | ICD-10-CM | POA: Diagnosis present

## 2024-04-18 DIAGNOSIS — F39 Unspecified mood [affective] disorder: Secondary | ICD-10-CM | POA: Diagnosis present

## 2024-04-18 DIAGNOSIS — F17210 Nicotine dependence, cigarettes, uncomplicated: Secondary | ICD-10-CM | POA: Diagnosis present

## 2024-04-18 DIAGNOSIS — Z7984 Long term (current) use of oral hypoglycemic drugs: Secondary | ICD-10-CM | POA: Diagnosis not present

## 2024-04-18 DIAGNOSIS — I5082 Biventricular heart failure: Secondary | ICD-10-CM | POA: Diagnosis present

## 2024-04-18 DIAGNOSIS — N179 Acute kidney failure, unspecified: Secondary | ICD-10-CM | POA: Diagnosis present

## 2024-04-18 DIAGNOSIS — I5021 Acute systolic (congestive) heart failure: Secondary | ICD-10-CM | POA: Diagnosis present

## 2024-04-18 DIAGNOSIS — I130 Hypertensive heart and chronic kidney disease with heart failure and stage 1 through stage 4 chronic kidney disease, or unspecified chronic kidney disease: Secondary | ICD-10-CM | POA: Diagnosis present

## 2024-04-18 DIAGNOSIS — E1122 Type 2 diabetes mellitus with diabetic chronic kidney disease: Secondary | ICD-10-CM | POA: Diagnosis present

## 2024-04-18 DIAGNOSIS — Z833 Family history of diabetes mellitus: Secondary | ICD-10-CM | POA: Diagnosis not present

## 2024-04-18 DIAGNOSIS — E875 Hyperkalemia: Secondary | ICD-10-CM | POA: Diagnosis present

## 2024-04-18 DIAGNOSIS — E66812 Obesity, class 2: Secondary | ICD-10-CM | POA: Diagnosis present

## 2024-04-18 DIAGNOSIS — F40240 Claustrophobia: Secondary | ICD-10-CM | POA: Diagnosis present

## 2024-04-18 DIAGNOSIS — E1165 Type 2 diabetes mellitus with hyperglycemia: Secondary | ICD-10-CM | POA: Diagnosis present

## 2024-04-18 DIAGNOSIS — E7800 Pure hypercholesterolemia, unspecified: Secondary | ICD-10-CM | POA: Diagnosis present

## 2024-04-18 DIAGNOSIS — I2510 Atherosclerotic heart disease of native coronary artery without angina pectoris: Secondary | ICD-10-CM | POA: Diagnosis present

## 2024-04-18 DIAGNOSIS — E1140 Type 2 diabetes mellitus with diabetic neuropathy, unspecified: Secondary | ICD-10-CM | POA: Diagnosis present

## 2024-04-18 DIAGNOSIS — I110 Hypertensive heart disease with heart failure: Secondary | ICD-10-CM | POA: Diagnosis not present

## 2024-04-18 DIAGNOSIS — I5033 Acute on chronic diastolic (congestive) heart failure: Secondary | ICD-10-CM | POA: Diagnosis present

## 2024-04-18 DIAGNOSIS — Z1152 Encounter for screening for COVID-19: Secondary | ICD-10-CM | POA: Diagnosis not present

## 2024-04-18 DIAGNOSIS — I420 Dilated cardiomyopathy: Secondary | ICD-10-CM | POA: Diagnosis present

## 2024-04-18 LAB — COMPREHENSIVE METABOLIC PANEL WITH GFR
ALT: 23 U/L (ref 0–44)
ALT: 22 U/L (ref 0–44)
AST: 27 U/L (ref 15–41)
AST: 23 U/L (ref 15–41)
Albumin: 3.3 g/dL — ABNORMAL LOW (ref 3.5–5.0)
Albumin: 3.2 g/dL — ABNORMAL LOW (ref 3.5–5.0)
Alkaline Phosphatase: 95 U/L (ref 38–126)
Alkaline Phosphatase: 90 U/L (ref 38–126)
Anion gap: 13 (ref 5–15)
Anion gap: 11 (ref 5–15)
BUN: 12 mg/dL (ref 8–23)
BUN: 11 mg/dL (ref 8–23)
CO2: 30 mmol/L (ref 22–32)
CO2: 32 mmol/L (ref 22–32)
Calcium: 9.7 mg/dL (ref 8.9–10.3)
Calcium: 9.5 mg/dL (ref 8.9–10.3)
Chloride: 93 mmol/L — ABNORMAL LOW (ref 98–111)
Chloride: 94 mmol/L — ABNORMAL LOW (ref 98–111)
Creatinine, Ser: 1.15 mg/dL (ref 0.61–1.24)
Creatinine, Ser: 1.04 mg/dL (ref 0.61–1.24)
GFR, Estimated: >60 mL/min (ref >60)
GFR, Estimated: >60 mL/min (ref >60)
Glucose, Bld: 205 mg/dL — ABNORMAL HIGH (ref 70–99)
Glucose, Bld: 159 mg/dL — ABNORMAL HIGH (ref 70–99)
Potassium: 4.2 mmol/L (ref 3.5–5.1)
Potassium: 4 mmol/L (ref 3.5–5.1)
Sodium: 136 mmol/L (ref 135–145)
Sodium: 137 mmol/L (ref 135–145)
Total Bilirubin: 0.7 mg/dL (ref 0.0–1.2)
Total Bilirubin: 1 mg/dL (ref 0.0–1.2)
Total Protein: 6.5 g/dL (ref 6.5–8.1)
Total Protein: 6.5 g/dL (ref 6.5–8.1)

## 2024-04-18 LAB — D-DIMER, QUANTITATIVE: D-Dimer, Quant: 0.63 ug{FEU}/mL — ABNORMAL HIGH (ref 0.00–0.50)

## 2024-04-18 LAB — RAPID URINE DRUG SCREEN, HOSP PERFORMED
Amphetamines: NOT DETECTED
Barbiturates: NOT DETECTED
Benzodiazepines: NOT DETECTED
Cocaine: POSITIVE — AB
Opiates: NOT DETECTED
Tetrahydrocannabinol: NOT DETECTED

## 2024-04-18 LAB — GLUCOSE, CAPILLARY
Glucose-Capillary: 173 mg/dL — ABNORMAL HIGH (ref 70–99)
Glucose-Capillary: 260 mg/dL — ABNORMAL HIGH (ref 70–99)
Glucose-Capillary: 180 mg/dL — ABNORMAL HIGH (ref 70–99)
Glucose-Capillary: 273 mg/dL — ABNORMAL HIGH (ref 70–99)
Glucose-Capillary: 197 mg/dL — ABNORMAL HIGH (ref 70–99)

## 2024-04-18 LAB — CBC WITH DIFFERENTIAL/PLATELET
Abs Immature Granulocytes: 0.04 K/uL (ref 0.00–0.07)
Basophils Absolute: 0 K/uL (ref 0.0–0.1)
Basophils Relative: 0 %
Eosinophils Absolute: 0.2 K/uL (ref 0.0–0.5)
Eosinophils Relative: 2 %
HCT: 48.1 % (ref 39.0–52.0)
Hemoglobin: 15.7 g/dL (ref 13.0–17.0)
Immature Granulocytes: 0 %
Lymphocytes Relative: 32 %
Lymphs Abs: 3.4 K/uL (ref 0.7–4.0)
MCH: 29.8 pg (ref 26.0–34.0)
MCHC: 32.6 g/dL (ref 30.0–36.0)
MCV: 91.4 fL (ref 80.0–100.0)
Monocytes Absolute: 0.7 K/uL (ref 0.1–1.0)
Monocytes Relative: 7 %
Neutro Abs: 6 K/uL (ref 1.7–7.7)
Neutrophils Relative %: 59 %
Platelets: 243 K/uL (ref 150–400)
RBC: 5.26 MIL/uL (ref 4.22–5.81)
RDW: 12.2 % (ref 11.5–15.5)
WBC: 10.4 K/uL (ref 4.0–10.5)
nRBC: 0 % (ref 0.0–0.2)

## 2024-04-18 LAB — PHOSPHORUS
Phosphorus: 2.3 mg/dL — ABNORMAL LOW (ref 2.5–4.6)
Phosphorus: 2.5 mg/dL (ref 2.5–4.6)

## 2024-04-18 LAB — ECHOCARDIOGRAM COMPLETE
Height: 70 in
S' Lateral: 5.3 cm
Weight: 4432 [oz_av]

## 2024-04-18 LAB — CBC
HCT: 47.7 % (ref 39.0–52.0)
Hemoglobin: 15.5 g/dL (ref 13.0–17.0)
MCH: 29.4 pg (ref 26.0–34.0)
MCHC: 32.5 g/dL (ref 30.0–36.0)
MCV: 90.3 fL (ref 80.0–100.0)
Platelets: 228 K/uL (ref 150–400)
RBC: 5.28 MIL/uL (ref 4.22–5.81)
RDW: 12.1 % (ref 11.5–15.5)
WBC: 9.7 K/uL (ref 4.0–10.5)
nRBC: 0 % (ref 0.0–0.2)

## 2024-04-18 LAB — TSH: TSH: 1.26 u[IU]/mL (ref 0.350–4.500)

## 2024-04-18 LAB — MAGNESIUM
Magnesium: 1.7 mg/dL (ref 1.7–2.4)
Magnesium: 1.8 mg/dL (ref 1.7–2.4)

## 2024-04-18 LAB — TROPONIN I (HIGH SENSITIVITY)
Troponin I (High Sensitivity): 58 ng/L — ABNORMAL HIGH (ref <18)
Troponin I (High Sensitivity): 69 ng/L — ABNORMAL HIGH (ref <18)

## 2024-04-18 LAB — HEMOGLOBIN A1C
Hgb A1c MFr Bld: 7.8 % — ABNORMAL HIGH (ref 4.8–5.6)
Mean Plasma Glucose: 177.16 mg/dL

## 2024-04-18 LAB — HIV ANTIBODY (ROUTINE TESTING W REFLEX): HIV Screen 4th Generation wRfx: NONREACTIVE

## 2024-04-18 MED ORDER — IOHEXOL 350 MG/ML SOLN
75.0000 mL | Freq: Once | INTRAVENOUS | Status: AC | PRN
  Administered 2024-04-18: 75 mL via INTRAVENOUS

## 2024-04-18 MED ORDER — SODIUM PHOSPHATES 45 MMOLE/15ML IV SOLN
15.0000 mmol | Freq: Once | INTRAVENOUS | Status: AC
  Administered 2024-04-18: 15 mmol via INTRAVENOUS
  Filled 2024-04-18: qty 5

## 2024-04-18 MED ORDER — HYDROXYZINE HCL 25 MG PO TABS
25.0000 mg | ORAL_TABLET | Freq: Three times a day (TID) | ORAL | Status: DC | PRN
Start: 2024-04-18 — End: 2024-04-24
  Administered 2024-04-18 – 2024-04-23 (×6): 25 mg via ORAL
  Filled 2024-04-18 (×6): qty 1

## 2024-04-18 MED ORDER — PREGABALIN 100 MG PO CAPS
100.0000 mg | ORAL_CAPSULE | Freq: Three times a day (TID) | ORAL | Status: DC
  Administered 2024-04-18 – 2024-04-19 (×6): 100 mg via ORAL
  Filled 2024-04-18 (×6): qty 1

## 2024-04-18 MED ORDER — ENOXAPARIN SODIUM 60 MG/0.6ML IJ SOSY
60.0000 mg | PREFILLED_SYRINGE | Freq: Every day | INTRAMUSCULAR | Status: DC
  Administered 2024-04-18 – 2024-04-23 (×6): 60 mg via SUBCUTANEOUS
  Filled 2024-04-18 (×7): qty 0.6

## 2024-04-18 MED ORDER — PERFLUTREN LIPID MICROSPHERE
1.0000 mL | INTRAVENOUS | Status: AC | PRN
  Administered 2024-04-18: 2 mL via INTRAVENOUS

## 2024-04-18 MED ORDER — LISINOPRIL 20 MG PO TABS
40.0000 mg | ORAL_TABLET | Freq: Every day | ORAL | Status: DC
  Administered 2024-04-18 – 2024-04-19 (×2): 40 mg via ORAL
  Filled 2024-04-18 (×2): qty 2

## 2024-04-18 MED ORDER — EMPAGLIFLOZIN 10 MG PO TABS
10.0000 mg | ORAL_TABLET | Freq: Every day | ORAL | Status: DC
  Administered 2024-04-18 – 2024-04-24 (×7): 10 mg via ORAL
  Filled 2024-04-18 (×7): qty 1

## 2024-04-18 NOTE — Assessment & Plan Note (Signed)
 -  Order Sensitive  SSI     -  check TSH and HgA1C  - Hold by mouth medications*

## 2024-04-18 NOTE — Assessment & Plan Note (Signed)
 Appears to be in sinus tachycardia Check TSH  Echo  Check d.dimer

## 2024-04-18 NOTE — Progress Notes (Signed)
 Mobility Specialist Progress Note:   04/18/24 1332  Mobility  Activity Ambulated independently  Level of Assistance Independent  Assistive Device None  Distance Ambulated (ft) 300 ft  Activity Response Tolerated well  Mobility Referral Yes  Mobility visit 1 Mobility  Mobility Specialist Start Time (ACUTE ONLY) 1332  Mobility Specialist Stop Time (ACUTE ONLY) 1342  Mobility Specialist Time Calculation (min) (ACUTE ONLY) 10 min   RN requested ambulatory O2 test. Pt received in bed, required time to prepare for session. Ambulated independently after encouragement. SpO2 90-92% on RA, Max HR 120 bpm. Returned pt to room, left with all needs met. RN notified.   Kolette Vey Mobility Specialist Please contact via Special Educational Needs Teacher or  Rehab office at [phone]

## 2024-04-18 NOTE — Assessment & Plan Note (Signed)
 Patient currently not taking any statins

## 2024-04-18 NOTE — Hospital Course (Addendum)
 Charles Nunez is a 63 y.o. male with PMH of  Dm2, HTN OSA, active tobacco abuse who presented with increased shortness of breath, cough,wheezing and increased work of breathing since 04/16/24. CT angio chest negative for PE or pneumonia, findings of acute bronchitis. Echocardiogram shows LVEF 20 to 25%.  Patient receiving diuresis with IV Lasix.  Underwent right and left heart catheterization per cardiology.  No CAD.   Assessment and Plan: Acute on chronic diastolic CHF Nonischemic cardiomyopathy Presented w/ shortness of breath cough and abnormal BNP 671m, trop up-flat chest x-ray/CTA no PE, question bronchitis Echocardiogram shows LVEF 20 to 25%, regional wall motion abnormalities, normal RV function. Left and right heart cath 11/7: No CAD, severe NICM EF 20 to 25%, well compensated filling pressure with moderate to severely depressed CO due to biventricular failure GDMT: Will be continued. Cardiac MRI ordered although patient unable to tolerate it due to machine being warm. Will defer to cardiology with regards to need for further workup.   Hypercholesterolemia: Not on meds.   HTN: Blood pressure stable.  Monitor.   DM 2: PTA on  PO meds only.  Continue SSI for now,    OSA: noncompliant with CPAP    Obesity Class 2 Body mass index is 38.76 kg/m.  Placing the pt at higher risk of poor outcomes.

## 2024-04-18 NOTE — Consult Note (Signed)
 CARDIOLOGY CONSULT NOTE       Patient ID: Charles Nunez MRN: [id number] DOB/AGE: 10/31/1960 63 y.o.  Admit date: 04/17/2024 Referring Physician: Silvester Primary Physician: [facility], [facility] Primary Cardiologist: New Reason for Consultation: CHF  Principal Problem:   Acute on chronic diastolic CHF (congestive heart failure) (HCC) Active Problems:   Essential hypertension   Hypercholesterolemia   OSA (obstructive sleep apnea)   Uncontrolled type 2 diabetes mellitus with hyperglycemia, with long-term current use of insulin  (HCC)   Morbid obesity with BMI of 40.0-44.9, adult (HCC)   Tachycardia   HPI:  63 y.o. admitted with 2 days of cough and dyspnea. Obese, sedentary on disability for back issues. Lives with wife and one son at home. Parties I.e. uses cocaine, smokes Denies ETOH. No prior documented cardiac issues. A1c > 7 DM poorly controlled Denies chest pain. Tachycardic on admission. CXR with CHF, BNP 700's CTA with no PE chronic bronchitis with small bilateral effusions. On Farxiga and glipizide for sugar. Given ACE/lisinopril in hospital although he was not taking regularly at home.   ROS All other systems reviewed and negative except as noted above  Past Medical History:  Diagnosis Date   Back pain    Diabetes mellitus without complication (HCC)     Family History  Problem Relation Age of Onset   Diabetes Mother    Diabetes Father    Alpha-1 antitrypsin deficiency Neg Hx    Lung cancer Neg Hx     Social History   Socioeconomic History   Marital status: Married    Spouse name: Not on file   Number of children: Not on file   Years of education: Not on file   Highest education level: Not on file  Occupational History   Not on file  Tobacco Use   Smoking status: Every Day    Current packs/day: 0.25    Average packs/day: 0.3 packs/day for 45.8 years (11.5 ttl pk-yrs)    Types: Cigarettes    Start date: 06/23/1978   Smokeless tobacco: Never  Substance and  Sexual Activity   Alcohol use: No   Drug use: Not Currently   Sexual activity: Not on file  Other Topics Concern   Not on file  Social History Narrative   Not on file   Social Drivers of Health   Financial Resource Strain: Not on file  Food Insecurity: No Food Insecurity (04/17/2024)   Hunger Vital Sign    Worried About Running Out of Food in the Last Year: Never true    Ran Out of Food in the Last Year: Never true  Transportation Needs: No Transportation Needs (04/17/2024)   PRAPARE - Administrator, Civil Service (Medical): No    Lack of Transportation (Non-Medical): No  Physical Activity: Not on file  Stress: Not on file  Social Connections: Not on file  Intimate Partner Violence: Not At Risk (04/17/2024)   Humiliation, Afraid, Rape, and Kick questionnaire    Fear of Current or Ex-Partner: No    Emotionally Abused: No    Physically Abused: No    Sexually Abused: No    Past Surgical History:  Procedure Laterality Date   BACK SURGERY        Current Facility-Administered Medications:    0.9 %  sodium chloride infusion, 250 mL, Intravenous, PRN, Doutova, Anastassia, MD   acetaminophen  (TYLENOL ) tablet 650 mg, 650 mg, Oral, Q6H PRN **OR** acetaminophen  (TYLENOL ) suppository 650 mg, 650 mg, Rectal, Q6H PRN, Doutova, Anastassia, MD  albuterol (PROVENTIL) (2.5 MG/3ML) 0.083% nebulizer solution 2.5 mg, 2.5 mg, Nebulization, Q2H PRN, Doutova, Anastassia, MD   aspirin EC tablet 81 mg, 81 mg, Oral, Daily, Doutova, Anastassia, MD, 81 mg at 04/18/24 0930   enoxaparin (LOVENOX) injection 60 mg, 60 mg, Subcutaneous, Daily, Pham, Minh Q, RPH-CPP, 60 mg at 04/18/24 1228   furosemide (LASIX) injection 40 mg, 40 mg, Intravenous, Q12H, Doutova, Anastassia, MD, 40 mg at 04/18/24 0522   guaiFENesin (MUCINEX) 12 hr tablet 600 mg, 600 mg, Oral, BID, Doutova, Anastassia, MD, 600 mg at 04/18/24 0930   HYDROcodone -acetaminophen  (NORCO/VICODIN) 5-325 MG per tablet 1-2 tablet, 1-2 tablet,  Oral, Q4H PRN, Doutova, Anastassia, MD, 2 tablet at 04/18/24 1442   hydrOXYzine (ATARAX) tablet 25 mg, 25 mg, Oral, TID PRN, Christobal Guadalajara, MD, 25 mg at 04/18/24 1533   insulin  aspart (novoLOG) injection 0-15 Units, 0-15 Units, Subcutaneous, TID WC, Doutova, Anastassia, MD, 3 Units at 04/18/24 1229   lisinopril (ZESTRIL) tablet 40 mg, 40 mg, Oral, Daily, Kc, Ramesh, MD, 40 mg at 04/18/24 0930   nicotine (NICODERM CQ - dosed in mg/24 hours) patch 14 mg, 14 mg, Transdermal, Daily, Doutova, Anastassia, MD   ondansetron  (ZOFRAN ) tablet 4 mg, 4 mg, Oral, Q6H PRN **OR** ondansetron  (ZOFRAN ) injection 4 mg, 4 mg, Intravenous, Q6H PRN, Doutova, Anastassia, MD   pregabalin (LYRICA) capsule 100 mg, 100 mg, Oral, TID, Doutova, Anastassia, MD, 100 mg at 04/18/24 1640   sodium chloride flush (NS) 0.9 % injection 3 mL, 3 mL, Intravenous, Q12H, Doutova, Anastassia, MD, 3 mL at 04/18/24 0931   sodium chloride flush (NS) 0.9 % injection 3 mL, 3 mL, Intravenous, PRN, Doutova, Anastassia, MD  aspirin EC  81 mg Oral Daily   enoxaparin (LOVENOX) injection  60 mg Subcutaneous Daily   furosemide  40 mg Intravenous Q12H   guaiFENesin  600 mg Oral BID   insulin  aspart  0-15 Units Subcutaneous TID WC   lisinopril  40 mg Oral Daily   nicotine  14 mg Transdermal Daily   pregabalin  100 mg Oral TID   sodium chloride flush  3 mL Intravenous Q12H    sodium chloride      Physical Exam: Blood pressure 120/80, pulse (!) 101, temperature 98.2 F (36.8 C), temperature source Oral, resp. rate 20, height 5' 10 (1.778 m), weight 125.6 kg, SpO2 100%.   Obese emotional black male Decreased BS base Distant heart sounds Abdomen benign Plus one edema JVP hard to evaluate due to obesity  Labs:   Lab Results  Component Value Date   WBC 9.7 04/18/2024   HGB 15.5 04/18/2024   HCT 47.7 04/18/2024   MCV 90.3 04/18/2024   PLT 228 04/18/2024    Recent Labs  Lab 04/18/24 0336  NA 137  K 4.0  CL 94*  CO2 32  BUN 11   CREATININE 1.04  CALCIUM 9.5  PROT 6.5  BILITOT 1.0  ALKPHOS 90  ALT 22  AST 23  GLUCOSE 159*      Radiology: ECHOCARDIOGRAM COMPLETE Result Date: 04/18/2024    ECHOCARDIOGRAM REPORT   Patient Name:   Charles Nunez Date of Exam: 04/18/2024 Medical Rec #:  [id number]    Height:       70.0 in Accession #:    [id number]   Weight:       277.0 lb Date of Birth:  [birth year]    BSA:          2.397 m Patient Age:    48 years  BP:           107/62 mmHg Patient Gender: M            HR:           100 bpm. Exam Location:  Inpatient Procedure: 2D Echo and Intracardiac Opacification Agent (Both Spectral and Color            Flow Doppler were utilized during procedure). Indications:    CHF  History:        Patient has no prior history of Echocardiogram examinations.  Sonographer:    Charmaine Gaskins Referring Phys: ANASTASSIA DOUTOVA IMPRESSIONS  1. Global hypokinesis wiht akinesis of the basal to mid inferoseptum, inferior and posterolateral myocardium. Left ventricular ejection fraction, by estimation, is 20 to 25%. The left ventricle has severely decreased function. The left ventricle demonstrates regional wall motion abnormalities (see scoring diagram/findings for description). There is mild concentric left ventricular hypertrophy. Left ventricular diastolic parameters are indeterminate.  2. Right ventricular systolic function is normal. The right ventricular size is normal. There is normal pulmonary artery systolic pressure.  3. Left atrial size was severely dilated.  4. The mitral valve is normal in structure. No evidence of mitral valve regurgitation. No evidence of mitral stenosis.  5. The aortic valve is tricuspid. Aortic valve regurgitation is not visualized. No aortic stenosis is present.  6. Aortic dilatation noted. There is mild dilatation of the ascending aorta, measuring 40 mm.  7. The inferior vena cava is normal in size with greater than 50% respiratory variability, suggesting right atrial pressure of  3 mmHg. FINDINGS  Left Ventricle: Global hypokinesis wiht akinesis of the basal to mid inferoseptum, inferior and posterolateral myocardium. Left ventricular ejection fraction, by estimation, is 20 to 25%. The left ventricle has severely decreased function. The left ventricle demonstrates regional wall motion abnormalities. Definity contrast agent was given IV to delineate the left ventricular endocardial borders. The left ventricular internal cavity size was normal in size. There is mild concentric left ventricular  hypertrophy. Left ventricular diastolic parameters are indeterminate.  LV Wall Scoring: The inferior wall, posterior wall, mid inferoseptal segment, and basal inferoseptal segment are akinetic. The entire anterior wall, antero-lateral wall, entire anterior septum, and entire apex are hypokinetic. Right Ventricle: The right ventricular size is normal. No increase in right ventricular wall thickness. Right ventricular systolic function is normal. There is normal pulmonary artery systolic pressure. The tricuspid regurgitant velocity is 1.87 m/s, and  with an assumed right atrial pressure of 3 mmHg, the estimated right ventricular systolic pressure is 17.0 mmHg. Left Atrium: Left atrial size was severely dilated. Right Atrium: Right atrial size was normal in size. Pericardium: There is no evidence of pericardial effusion. Mitral Valve: The mitral valve is normal in structure. No evidence of mitral valve regurgitation. No evidence of mitral valve stenosis. Tricuspid Valve: The tricuspid valve is normal in structure. Tricuspid valve regurgitation is trivial. No evidence of tricuspid stenosis. Aortic Valve: The aortic valve is tricuspid. Aortic valve regurgitation is not visualized. No aortic stenosis is present. Pulmonic Valve: The pulmonic valve was normal in structure. Pulmonic valve regurgitation is not visualized. No evidence of pulmonic stenosis. Aorta: Aortic dilatation noted. There is mild dilatation  of the ascending aorta, measuring 40 mm. Venous: The inferior vena cava is normal in size with greater than 50% respiratory variability, suggesting right atrial pressure of 3 mmHg. IAS/Shunts: No atrial level shunt detected by color flow Doppler.  LEFT VENTRICLE PLAX 2D LVIDd:  5.80 cm LVIDs:         5.30 cm LV PW:         1.20 cm LV IVS:        1.10 cm LVOT diam:     2.40 cm LVOT Area:     4.52 cm  RIGHT VENTRICLE RV Basal diam:  3.20 cm RV Mid diam:    2.80 cm RV S prime:     11.70 cm/s TAPSE (M-mode): 1.7 cm LEFT ATRIUM              Index        RIGHT ATRIUM           Index LA diam:        4.40 cm  1.84 cm/m   RA Area:     16.20 cm LA Vol (A2C):   105.0 ml 43.80 ml/m  RA Volume:   43.70 ml  18.23 ml/m LA Vol (A4C):   99.2 ml  41.38 ml/m LA Biplane Vol: 106.0 ml 44.22 ml/m   AORTA Ao Root diam: 3.60 cm Ao Asc diam:  3.95 cm TRICUSPID VALVE TR Peak grad:   14.0 mmHg TR Vmax:        187.00 cm/s  SHUNTS Systemic Diam: 2.40 cm Annabella Scarce MD Electronically signed by Annabella Scarce MD Signature Date/Time: 04/18/2024/2:58:31 PM    Final    CT Angio Chest Pulmonary Embolism (PE) W or WO Contrast Result Date: 04/18/2024 EXAM: LUNG CANCER SCREENING CHEST CT WITH AND WITHOUT CONTRAST 04/18/2024 05:04:45 AM TECHNIQUE: Low-dose CT of the chest was performed with and without the administration of 75 mL of iohexol (OMNIPAQUE) 350 MG/ML injection. Multiplanar reformatted images are provided for review. Automated exposure control, iterative reconstruction, and/or weight based adjustment of the mA/kV was utilized to reduce the radiation dose to as low as reasonably achievable. COMPARISON: Chest radiographs 04/17/2024. CT abdomen and pelvis 03/28/2017. CLINICAL HISTORY: 63 year old male. Pulmonary embolism (PE) suspected, high probability. FINDINGS: MEDIASTINUM: Borderline cardiomegaly. No pericardial effusion. Little contrast in the aorta. Mild for age calcified aortic atherosclerosis. LYMPH NODES: No  mediastinal, hilar or axillary lymphadenopathy. LUNGS AND PLEURA: Good pulmonary artery contrast timing. Minor respiratory motion, which is limited to the lung bases. Central pulmonary arteries are normally enhancing. No convincing distal pulmonary artery filling defect. Small bilateral layering pleural effusions simple fluid density favoring transudate. There is generalized bronchial wall thickening most apparent at the hila. Atelectatic changes to the central airways and bilateral bronchi narrowing. No focal consolidation or pulmonary edema. Asymmetric and dependent pulmonary ground glass opacity more resembles atelectasis than respiratory infection. No pneumothorax. No suspicious pulmonary nodules. SOFT TISSUES AND BONES: Advanced thoracic spine degeneration with superimposed hyperostosis resulting in occasional thoracic vertebral ankylosis. Chronic rib fractures. No acute abnormality of the soft tissues. UPPER ABDOMEN: Non-contrast visible upper abdominal viscera appear stable since 2018, including chronic left upper pole renal cyst which appears simple and benign (no follow up imaging recommended). IMPRESSION: 1. No pulmonary embolism identified. 2. Generalized bronchial wall thickening and bilateral bronchial narrowing. No consolidation. Small bilateral layering pleural effusions. Consider acute or chronic Bronchitis, with atelectasis but no convincing pneumonia at this time. Electronically signed by: Helayne Hurst MD 04/18/2024 05:20 AM EST RP Workstation: [id number]   DG Chest 2 View Result Date: 04/17/2024 EXAM: 2 VIEW(S) XRAY OF THE CHEST 04/17/2024 07:01:00 AM COMPARISON: None available. CLINICAL HISTORY: SOB SOB FINDINGS: LUNGS AND PLEURA: No focal pulmonary opacity. Pulmonary vascular congestion and mild edema. Trace pleural effusion is identified  with thickening of the right minor fissure. No pneumothorax. HEART AND MEDIASTINUM: Mild cardiac enlargement. BONES AND SOFT TISSUES: No acute osseous  abnormality. IMPRESSION: 1. Pulmonary vascular congestion with mild interstitial pulmonary edema and trace pleural effusion. Correlate for signs or symptoms of congestive heart failure. Electronically signed by: Waddell Calk MD 04/17/2024 07:56 AM EST RP Workstation: [id number]    EKG: ST rate 107 nonspecific ST changes   ASSESSMENT AND PLAN:   CHF:  acute systolic CHF in setting of smoking, cocaine use and non compliance with most of his meds for HTN/DM. He was quite emotional whe I tried to explain the diagnosis of systolic CHF. He has been given lisinopril and was on it at home so cannot use entresto. Cannot use beta blocker with UDS positive for cocaine. Continue iv lasix 40 mg bid. Continue lisinopril 40 mg. Echo with EF 20-25% normal RV no significant valve dx. He can lay flat but prefers to sleep on his side. Tentatively plan for cath on Friday Substance Abuse:  discussed need to stop cocaine and smoking CT with chronic bronchitis no cancer DM:  poor control start Jardiance 10 mg A1c > 7 OSA:  not compliant with CPAP with this and severe LAE at risk for afib  Signed: Maude Emmer 04/18/2024, 6:08 PM

## 2024-04-18 NOTE — Progress Notes (Signed)
 [facility] night cross cover note:   As requested by admitting Hospitalist, I followed-up on this pt's d-dimer result, which was very mildly elevated at 0.63, right at cut-off following age-related adjustment. I subsequently ordered CTA chest with PE protocol to evaluate for any underlying acute pulmonary embolism in the setting of the pt's presenting sob/tachycardia. Updated creatinine per cmp this AM noted to be 1.15.   Eva Pore, DO Hospitalist

## 2024-04-18 NOTE — Plan of Care (Signed)

## 2024-04-18 NOTE — Progress Notes (Signed)
 Rx consulted for lovenox DVT prophylaxis.   BMI 40  Lovenox 60mg  SQ qday  Sergio Batch, PharmD, [place], AAHIVP, CPP Infectious Disease Pharmacist 04/18/2024 12:20 PM

## 2024-04-18 NOTE — Care Management Obs Status (Signed)
 MEDICARE OBSERVATION STATUS NOTIFICATION   Patient Details  Name: Charles Nunez MRN: [id number] Date of Birth: Feb 08, 1961   Medicare Observation Status Notification Given:  Yes    Vonzell Arrie Sharps 04/18/2024, 11:44 AM

## 2024-04-18 NOTE — Assessment & Plan Note (Signed)
Not compliant with CPAP 

## 2024-04-18 NOTE — Progress Notes (Signed)
 PROGRESS NOTE Rigel Filsinger  FMW:[id number] DOB: December 16, 1960 DOA: 04/17/2024 PCP: [facility], [facility] [address]  Brief Narrative/Hospital Course: Eliyas Suddreth is a 63 y.o. male with PMH of  Dm2, HTN OSA, active tobacco abuse who presented with increased shortness of breath, cough,wheezing and increased work of breathing since 04/16/24. In ED: Vitals stable BP mild tachycardia 101, not hypoxic.  Labs showed hypophosphatemia stable renal function CBC troponin 44> 42> 58> 69, proBNP 613 Chest x-ray showed pulmonary edema noted to have elevated BNP in 700s ttempted nebulizer treatment but did not improved his symptoms significantly Given IV Lasix in the ER. D-dimer subsequently resulted borderline elevated 0.6 CT angio chest obtained: No PE, finding of acute or chronic bronchitis with atelectasis noted.  Subjective: Seen and examined today Wife at the bedside He feels much improved since getting Lasix at [facility] no shortness of breath no orthopnea Overnight afebrile on room air, VSS, Labs stable  Assessment and plan:  Acute on chronic diastolic CHF Elevated BNP likely demand ischemia: Presented w/ shortness of breath cough and abnormal BNP 667m, trop up-flat chest x-ray/CTA no PE, question bronchitis pending echo  Continue IV diuresis and Monitor strict I/O,daly weight, electrolytes, Cont salt and fluid restricted diet. Net IO Since Admission: -159.88 mL [04/18/24 1255]  Filed Weights   04/17/24 9357 04/17/24 2139 04/18/24 0500  Weight: 127 kg 128 kg 125.6 kg    Recent Labs  Lab 04/17/24 0712 04/18/24 0044 04/18/24 0336  PROBNP 613.0*  --   --   BUN 11 12 11   CREATININE 1.00 1.15 1.04  K 4.6 4.2 4.0  MG  --  1.7 1.8    Hypercholesterolemia: Not on meds.  HTN: Borderline, resume on lisinopril   DM 2: PTA on  PO meds only.  Continue SSI for now, CBG/ A1c as below. Recent Labs  Lab 04/17/24 2130 04/18/24 0044 04/18/24 0636 04/18/24 0744 04/18/24 1152  GLUCAP 199*  --  173* 260*  180*  HGBA1C  --  7.8*  --   --   --     OSA: noncompliant with CPAP   Class II w/ Body mass index is 39.75 kg/m.: Will benefit with PCP follow-up, weight loss,healthy lifestyle and compliance  w/ CPAP  DVT prophylaxis: SCDs Start: 04/17/24 2344 add Lovenox Code Status:   Code Status: Full Code Family Communication: plan of care discussed with patient at bedside. Patient status is: Remains hospitalized because of severity of illness Level of care: Telemetry   Dispo: The patient is from: HOME            Anticipated disposition: TBD Objective: Vitals last 24 hrs: Vitals:   04/18/24 0830 04/18/24 0831 04/18/24 0930 04/18/24 1147  BP: 107/62 107/62 107/62 (!) 127/96  Pulse: (!) 44 (!) 105  98  Resp:    20  Temp:  98.2 F (36.8 C)  98.2 F (36.8 C)  TempSrc:  Oral  Oral  SpO2: (!) 84% 93%  99%  Weight:      Height:        Physical Examination: General exam: alert awake, oriented, older than stated age HEENT:Oral mucosa moist, Ear/Nose WNL grossly Respiratory system: Bilaterally clear BS,no use of accessory muscle Cardiovascular system: S1 & S2 +, No JVD. Gastrointestinal system: Abdomen soft,NT,ND, BS+ Nervous System: Alert, awake, moving all extremities,and following commands. Extremities: extremities warm, leg edema NEG Skin: Warm, no rashes MSK: Normal muscle bulk,tone, power   Medications reviewed:  Scheduled Meds:  aspirin EC  81 mg Oral Daily  enoxaparin (LOVENOX) injection  60 mg Subcutaneous Daily   furosemide  40 mg Intravenous Q12H   guaiFENesin  600 mg Oral BID   insulin  aspart  0-15 Units Subcutaneous TID WC   lisinopril  40 mg Oral Daily   nicotine  14 mg Transdermal Daily   pregabalin  100 mg Oral TID   sodium chloride flush  3 mL Intravenous Q12H   Continuous Infusions:  sodium chloride     Diet: Diet Order             Diet Carb Modified Fluid consistency: Thin; Room service appropriate? Yes  Diet effective now                      Data Reviewed: I have personally reviewed following labs and imaging studies ( see epic result tab) CBC: Recent Labs  Lab 04/17/24 0712 04/18/24 0044 04/18/24 0336  WBC 10.9* 10.4 9.7  NEUTROABS  --  6.0  --   HGB 15.6 15.7 15.5  HCT 48.7 48.1 47.7  MCV 92.6 91.4 90.3  PLT 227 243 228   CMP: Recent Labs  Lab 04/17/24 0712 04/18/24 0044 04/18/24 0336  NA 137 136 137  K 4.6 4.2 4.0  CL 102 93* 94*  CO2 29 30 32  GLUCOSE 175* 205* 159*  BUN 11 12 11   CREATININE 1.00 1.15 1.04  CALCIUM 10.0 9.7 9.5  MG  --  1.7 1.8  PHOS  --  2.3* 2.5   GFR: Estimated Creatinine Clearance: 96.7 mL/min (by C-G formula based on SCr of 1.04 mg/dL). Recent Labs  Lab 04/18/24 0044 04/18/24 0336  AST 27 23  ALT 23 22  ALKPHOS 95 90  BILITOT 0.7 1.0  PROT 6.5 6.5  ALBUMIN 3.3* 3.2*   No results for input(s): LIPASE, AMYLASE in the last 168 hours. No results for input(s): AMMONIA in the last 168 hours. Coagulation Profile: No results for input(s): INR, PROTIME in the last 168 hours. Unresulted Labs (From admission, onward)     Start     Ordered   04/19/24 0500  Basic metabolic panel with GFR  Daily,   R      04/18/24 0731   04/19/24 0500  CBC  Daily,   R      04/18/24 0731           Antimicrobials/Microbiology: Anti-infectives (From admission, onward)    None      No results found for: SDES, SPECREQUEST, CULT, REPTSTATUS  Procedures:    Mennie LAMY, MD [facility] 04/18/2024, 12:55 PM

## 2024-04-18 NOTE — Assessment & Plan Note (Signed)
 Contributing to comorbidity and complicating medical management  Body mass index is 40.49 kg/m.  Nutritional follow up as an out pt would be recommended\

## 2024-04-18 NOTE — Assessment & Plan Note (Signed)
 Restart lisinopril if renal function remained stable

## 2024-04-19 ENCOUNTER — Other Ambulatory Visit (HOSPITAL_COMMUNITY): Payer: Self-pay

## 2024-04-19 ENCOUNTER — Telehealth (HOSPITAL_COMMUNITY): Payer: Self-pay | Admitting: Pharmacy Technician

## 2024-04-19 DIAGNOSIS — I5033 Acute on chronic diastolic (congestive) heart failure: Secondary | ICD-10-CM | POA: Diagnosis not present

## 2024-04-19 DIAGNOSIS — I5021 Acute systolic (congestive) heart failure: Secondary | ICD-10-CM | POA: Diagnosis not present

## 2024-04-19 LAB — CBC
HCT: 49.9 % (ref 39.0–52.0)
Hemoglobin: 16 g/dL (ref 13.0–17.0)
MCH: 29.3 pg (ref 26.0–34.0)
MCHC: 32.1 g/dL (ref 30.0–36.0)
MCV: 91.2 fL (ref 80.0–100.0)
Platelets: 245 K/uL (ref 150–400)
RBC: 5.47 MIL/uL (ref 4.22–5.81)
RDW: 12 % (ref 11.5–15.5)
WBC: 9.9 K/uL (ref 4.0–10.5)
nRBC: 0 % (ref 0.0–0.2)

## 2024-04-19 LAB — BASIC METABOLIC PANEL WITH GFR
Anion gap: 10 (ref 5–15)
BUN: 16 mg/dL (ref 8–23)
CO2: 33 mmol/L — ABNORMAL HIGH (ref 22–32)
Calcium: 9.7 mg/dL (ref 8.9–10.3)
Chloride: 94 mmol/L — ABNORMAL LOW (ref 98–111)
Creatinine, Ser: 1.45 mg/dL — ABNORMAL HIGH (ref 0.61–1.24)
GFR, Estimated: 54 mL/min — ABNORMAL LOW (ref >60)
Glucose, Bld: 185 mg/dL — ABNORMAL HIGH (ref 70–99)
Potassium: 4.4 mmol/L (ref 3.5–5.1)
Sodium: 137 mmol/L (ref 135–145)

## 2024-04-19 LAB — GLUCOSE, CAPILLARY
Glucose-Capillary: 187 mg/dL — ABNORMAL HIGH (ref 70–99)
Glucose-Capillary: 192 mg/dL — ABNORMAL HIGH (ref 70–99)
Glucose-Capillary: 157 mg/dL — ABNORMAL HIGH (ref 70–99)

## 2024-04-19 MED ORDER — ASPIRIN 81 MG PO CHEW: 81.0000 mg | CHEWABLE_TABLET | ORAL | Status: AC

## 2024-04-19 MED ORDER — SODIUM CHLORIDE 0.9 % IV SOLN: INTRAVENOUS | Status: DC

## 2024-04-19 MED ORDER — LOSARTAN POTASSIUM 50 MG PO TABS
50.0000 mg | ORAL_TABLET | Freq: Every day | ORAL | Status: DC
  Administered 2024-04-19 – 2024-04-22 (×4): 50 mg via ORAL
  Filled 2024-04-19 (×4): qty 1

## 2024-04-19 NOTE — Progress Notes (Signed)
 Mobility Specialist Progress Note:    04/19/24 1100  Mobility  Activity Ambulated independently (hallway and into bathroom)  Level of Assistance Independent after set-up  Assistive Device None  Distance Ambulated (ft) 100 ft  Activity Response Tolerated well  Mobility Referral Yes  Mobility visit 1 Mobility  Mobility Specialist Start Time (ACUTE ONLY) 1100  Mobility Specialist Stop Time (ACUTE ONLY) 1107  Mobility Specialist Time Calculation (min) (ACUTE ONLY) 7 min   Received pt ambulating in the room resistant but agreeable to quick session before going to take his shower. No c/o any symptoms. Pt moving and ambulating well. Returned pt in room to shower w/ all needs met.   Venetia Keel Mobility Specialist Please Neurosurgeon or Rehab Office at [phone]

## 2024-04-19 NOTE — Plan of Care (Signed)
   Problem: Health Behavior/Discharge Planning: Goal: Ability to manage health-related needs will improve Outcome: Progressing   Problem: Education: Goal: Knowledge of General Education information will improve Description: Including pain rating scale, medication(s)/side effects and non-pharmacologic comfort measures Outcome: Progressing

## 2024-04-19 NOTE — Plan of Care (Signed)
  Problem: Activity: Goal: Risk for activity intolerance will decrease Outcome: Progressing   Problem: Coping: Goal: Level of anxiety will decrease Outcome: Progressing   Problem: Safety: Goal: Ability to remain free from injury will improve Outcome: Progressing   Problem: Coping: Goal: Ability to adjust to condition or change in health will improve Outcome: Progressing

## 2024-04-19 NOTE — Telephone Encounter (Signed)
 Patient Product/process Development Scientist completed.    The patient is insured through [employer]. Patient has Medicare and is not eligible for a copay card, but may be able to apply for patient assistance or Medicare RX Payment Plan (Patient Must reach out to their plan, if eligible for payment plan), if available.    Ran test claim for Jardiance 10 mg and the current 30 day co-pay is $0.00.   This test claim was processed through [facility]- copay amounts may vary at other pharmacies due to pharmacy/plan contracts, or as the patient moves through the different stages of their insurance plan.     Reyes Sharps, CPHT Pharmacy Technician Patient Advocate Specialist Lead [facility] Patient Advocate Team Direct Number: [phone]  Fax: [phone]

## 2024-04-19 NOTE — TOC CM/SW Note (Signed)
 Transition of Care [facility]) - Inpatient Brief Assessment   Patient Details  Name: Charles Nunez MRN: [id number] Date of Birth: 08/10/1960  Transition of Care [facility]) CM/SW Contact:    Lauraine FORBES Saa, LCSWA Phone Number: 04/19/2024, 2:16 PM   Clinical Narrative:  2:16 PM Per chart review, patient resides at home with spouse. Patient has a PCP and insurance. Patient does not have SNF or HH history. Patient has DME (CPAP) history. Patient's preferred pharmacy is [facility] [address]. TOC consult was placed for [facility] screen and SNF placement. TOC will continue to follow.  Transition of Care Asessment: Insurance and Status: Insurance coverage has been reviewed Patient has primary care physician: Yes Home environment has been reviewed: Private Residence Prior level of function:: N/A Prior/Current Home Services: No current home services Social Drivers of Health Review: SDOH reviewed no interventions necessary Readmission risk has been reviewed: Yes (Currently Yellow 18%) Transition of care needs: transition of care needs identified, TOC will continue to follow

## 2024-04-19 NOTE — Progress Notes (Signed)
 PROGRESS NOTE Charles Nunez  FMW:[id number] DOB: May 07, 1961 DOA: 04/17/2024 PCP: [facility] [address]  Brief Narrative/Hospital Course: Ronn Smolinsky is a 63 y.o. male with PMH of  Dm2, HTN OSA, active tobacco abuse who presented with increased shortness of breath, cough,wheezing and increased work of breathing since 04/16/24. In ED: Vitals stable BP mild tachycardia 101, not hypoxic.  Labs showed hypophosphatemia stable renal function CBC troponin 44> 42> 58> 69, proBNP 613 Chest x-ray showed pulmonary edema noted to have elevated BNP in 700s ttempted nebulizer treatment but did not improved his symptoms significantly Given IV Lasix in the ER. D-dimer subsequently resulted borderline elevated 0.6 CT angio chest obtained: No PE, finding of acute or chronic bronchitis with atelectasis noted. Echo showed new systolic dysfunction with EF 20-25% basal to mid inferoseptum inferior and posterolateral myocardium Akinesis with global hypokinesis-cardiology was consulted  Subjective: Seen and examined today Wife at the bedside. He is much better no shortness of breath Overnight mildly tachycardic, labs shows bump in creatinine 1.4 sodium potassium stable CBC okay  Assessment and plan:  Acute systolic CHF  Elevated troponin likely demand ischemia: Presented w/ shortness of breath cough and abnormal BNP 669m, trop up-flat chest x-ray/CTA no PE, question bronchitis Echo shows- EF 20-25% basal to mid inferoseptum inferior and posterolateral myocardium Akinesis with global hypokinesis Cardio consulted, plan is to continue diuresis and cardiac cath on Friday  GDMT limited, unable to beta-blockade due to cocaine use, was using lisinopril at home> switching to losartan, hopefully can start Aldactone.  Jardiance restarted. Monitor strict I/O,daly weight, electrolytes, Cont salt and fluid restricted diet. Net IO Since Admission: 120.12 mL [04/19/24 1123]  Filed Weights   04/17/24 2139 04/18/24 0500 04/19/24 0455   Weight: 128 kg 125.6 kg 125.6 kg    Recent Labs  Lab 04/17/24 0712 04/18/24 0044 04/18/24 0336 04/19/24 0259  PROBNP 613.0*  --   --   --   BUN 11 12 11 16   CREATININE 1.00 1.15 1.04 1.45*  K 4.6 4.2 4.0 4.4  MG  --  1.7 1.8  --     AKI: Creatinine elevated 1.4 from 1-1.15, patient has systolic dysfunction needing diuresis will need to monitor renal function closely  Hypercholesterolemia: Not on meds.  HTN: Stable continue home lisinopril    DM 2: PTA on  PO meds only.  Jardiance started, continue sliding scale.  A1c 7.8.  Recent Labs  Lab 04/18/24 0044 04/18/24 0636 04/18/24 0744 04/18/24 1152 04/18/24 1711 04/18/24 2119 04/19/24 0615  GLUCAP  --    < > 260* 180* 273* 197* 187*  HGBA1C 7.8*  --   --   --   --   --   --    < > = values in this interval not displayed.    OSA: noncompliant with CPAP   Class II w/ Body mass index is 39.73 kg/m.: Will benefit with PCP follow-up, weight loss,healthy lifestyle and compliance  w/ CPAP  Substance abuse with cocaine positive: Given his systolic dysfunction,need to quit substance abuse  11/5 Patient threatened to leave AGAINST MEDICAL ADVICE but subsequently was convinced to stay  DVT prophylaxis: SCDs Start: 04/17/24 2344 add Lovenox Code Status:   Code Status: Full Code Family Communication: plan of care discussed with patient at bedside. Patient status is: Remains hospitalized because of severity of illness Level of care: Telemetry   Dispo: The patient is from: HOME            Anticipated disposition: TBD Objective: Vitals  last 24 hrs: Vitals:   04/18/24 2349 04/19/24 0455 04/19/24 0624 04/19/24 0757  BP: 121/84 131/86 (!) 122/95 (!) 115/99  Pulse: (!) 108 (!) 106  (!) 111  Resp: 19 20  20   Temp: 97.7 F (36.5 C) 98.4 F (36.9 C)  98.8 F (37.1 C)  TempSrc: Oral Oral  Oral  SpO2: 100% 90%  95%  Weight:  125.6 kg    Height:        Physical Examination: General exam: AAOX4 HEENT:Oral mucosa moist,  Ear/Nose WNL grossly Respiratory system: Bilaterally clear BS,no use of accessory muscle Cardiovascular system: S1 & S2 +, No JVD. Gastrointestinal system: Abdomen soft,NT,ND, BS+ Nervous System: Alert, awake, moving all extremities,and following commands. Extremities: extremities warm, leg edema NEG Skin: Warm, no rashes MSK: Normal muscle bulk,tone, power   Medications reviewed:  Scheduled Meds:  aspirin EC  81 mg Oral Daily   empagliflozin  10 mg Oral Daily   enoxaparin (LOVENOX) injection  60 mg Subcutaneous Daily   furosemide  40 mg Intravenous Q12H   guaiFENesin  600 mg Oral BID   insulin  aspart  0-15 Units Subcutaneous TID WC   losartan  50 mg Oral Daily   nicotine  14 mg Transdermal Daily   pregabalin  100 mg Oral TID   sodium chloride flush  3 mL Intravenous Q12H   Continuous Infusions:  [START ON 04/20/2024] sodium chloride      Diet: Diet Order             Diet heart healthy/carb modified Room service appropriate? Yes; Fluid consistency: Thin  Diet effective now                     Data Reviewed: I have personally reviewed following labs and imaging studies ( see epic result tab) CBC: Recent Labs  Lab 04/17/24 0712 04/18/24 0044 04/18/24 0336 04/19/24 0259  WBC 10.9* 10.4 9.7 9.9  NEUTROABS  --  6.0  --   --   HGB 15.6 15.7 15.5 16.0  HCT 48.7 48.1 47.7 49.9  MCV 92.6 91.4 90.3 91.2  PLT 227 243 228 245   CMP: Recent Labs  Lab 04/17/24 0712 04/18/24 0044 04/18/24 0336 04/19/24 0259  NA 137 136 137 137  K 4.6 4.2 4.0 4.4  CL 102 93* 94* 94*  CO2 29 30 32 33*  GLUCOSE 175* 205* 159* 185*  BUN 11 12 11 16   CREATININE 1.00 1.15 1.04 1.45*  CALCIUM 10.0 9.7 9.5 9.7  MG  --  1.7 1.8  --   PHOS  --  2.3* 2.5  --    GFR: Estimated Creatinine Clearance: 69.3 mL/min (A) (by C-G formula based on SCr of 1.45 mg/dL (H)). Recent Labs  Lab 04/18/24 0044 04/18/24 0336  AST 27 23  ALT 23 22  ALKPHOS 95 90  BILITOT 0.7 1.0  PROT 6.5 6.5   ALBUMIN 3.3* 3.2*   No results for input(s): LIPASE, AMYLASE in the last 168 hours. No results for input(s): AMMONIA in the last 168 hours. Coagulation Profile: No results for input(s): INR, PROTIME in the last 168 hours. Unresulted Labs (From admission, onward)     Start     Ordered   04/19/24 0500  Basic metabolic panel with GFR  Daily,   R      04/18/24 0731   04/19/24 0500  CBC  Daily,   R      04/18/24 0731  Antimicrobials/Microbiology: Anti-infectives (From admission, onward)    None      No results found for: SDES, SPECREQUEST, CULT, REPTSTATUS  Procedures: Procedure(s) (LRB): RIGHT/LEFT HEART CATH AND CORONARY ANGIOGRAPHY (N/A)   Mennie LAMY, MD [facility] 04/19/2024, 11:24 AM

## 2024-04-19 NOTE — Progress Notes (Signed)
 Progress Note  Patient Name: Charles Nunez Date of Encounter: 04/19/2024  Primary Cardiologist: None   Subjective   Patient seen and examined at his bedside.   Inpatient Medications    Scheduled Meds:  aspirin EC  81 mg Oral Daily   empagliflozin  10 mg Oral Daily   enoxaparin (LOVENOX) injection  60 mg Subcutaneous Daily   furosemide  40 mg Intravenous Q12H   guaiFENesin  600 mg Oral BID   insulin  aspart  0-15 Units Subcutaneous TID WC   lisinopril  40 mg Oral Daily   nicotine  14 mg Transdermal Daily   pregabalin  100 mg Oral TID   sodium chloride flush  3 mL Intravenous Q12H   Continuous Infusions:  PRN Meds: acetaminophen  **OR** acetaminophen , albuterol, HYDROcodone -acetaminophen , hydrOXYzine, ondansetron  **OR** ondansetron  (ZOFRAN ) IV, sodium chloride flush   Vital Signs    Vitals:   04/18/24 2349 04/19/24 0455 04/19/24 0624 04/19/24 0757  BP: 121/84 131/86 (!) 122/95 (!) 115/99  Pulse: (!) 108 (!) 106  (!) 111  Resp: 19 20  20   Temp: 97.7 F (36.5 C) 98.4 F (36.9 C)  98.8 F (37.1 C)  TempSrc: Oral Oral  Oral  SpO2: 100% 90%  95%  Weight:  125.6 kg    Height:        Intake/Output Summary (Last 24 hours) at 04/19/2024 0928 Last data filed at 04/19/2024 0759 Gross per 24 hour  Intake 683 ml  Output 400 ml  Net 283 ml   Filed Weights   04/17/24 2139 04/18/24 0500 04/19/24 0455  Weight: 128 kg 125.6 kg 125.6 kg    Telemetry     - Personally Reviewed  ECG     - Personally Reviewed  Physical Exam    General: Comfortable Head: Atraumatic, normal size  Eyes: PEERLA, EOMI  Neck: Supple, normal JVD Cardiac: Normal S1, S2; RRR; no murmurs, rubs, or gallops Lungs: Clear to auscultation bilaterally Abd: Soft, nontender, no hepatomegaly  Ext: warm, no edema Musculoskeletal: No deformities, BUE and BLE strength normal and equal Skin: Warm and dry, no rashes   Neuro: Alert and oriented to person, place, time, and situation, CNII-XII grossly  intact, no focal deficits  Psych: Normal mood and affect   Labs    Chemistry Recent Labs  Lab 04/18/24 0044 04/18/24 0336 04/19/24 0259  NA 136 137 137  K 4.2 4.0 4.4  CL 93* 94* 94*  CO2 30 32 33*  GLUCOSE 205* 159* 185*  BUN 12 11 16   CREATININE 1.15 1.04 1.45*  CALCIUM 9.7 9.5 9.7  PROT 6.5 6.5  --   ALBUMIN 3.3* 3.2*  --   AST 27 23  --   ALT 23 22  --   ALKPHOS 95 90  --   BILITOT 0.7 1.0  --   GFRNONAA >60 >60 54*  ANIONGAP 13 11 10      Hematology Recent Labs  Lab 04/18/24 0044 04/18/24 0336 04/19/24 0259  WBC 10.4 9.7 9.9  RBC 5.26 5.28 5.47  HGB 15.7 15.5 16.0  HCT 48.1 47.7 49.9  MCV 91.4 90.3 91.2  MCH 29.8 29.4 29.3  MCHC 32.6 32.5 32.1  RDW 12.2 12.1 12.0  PLT 243 228 245    Cardiac EnzymesNo results for input(s): TROPONINI in the last 168 hours. No results for input(s): TROPIPOC in the last 168 hours.   BNP Recent Labs  Lab 04/17/24 0712  PROBNP 613.0*     DDimer  Recent Labs  Lab  04/18/24 0336  DDIMER 0.63*     Radiology    ECHOCARDIOGRAM COMPLETE Result Date: 04/18/2024    ECHOCARDIOGRAM REPORT   Patient Name:   Charles Nunez Date of Exam: 04/18/2024 Medical Rec #:  [id number]    Height:       70.0 in Accession #:    [id number]   Weight:       277.0 lb Date of Birth:  08-31-1960    BSA:          2.397 m Patient Age:    63 years     BP:           107/62 mmHg Patient Gender: M            HR:           100 bpm. Exam Location:  Inpatient Procedure: 2D Echo and Intracardiac Opacification Agent (Both Spectral and Color            Flow Doppler were utilized during procedure). Indications:    CHF  History:        Patient has no prior history of Echocardiogram examinations.  Sonographer:    Charmaine Gaskins Referring Phys: ANASTASSIA DOUTOVA IMPRESSIONS  1. Global hypokinesis wiht akinesis of the basal to mid inferoseptum, inferior and posterolateral myocardium. Left ventricular ejection fraction, by estimation, is 20 to 25%. The left ventricle  has severely decreased function. The left ventricle demonstrates regional wall motion abnormalities (see scoring diagram/findings for description). There is mild concentric left ventricular hypertrophy. Left ventricular diastolic parameters are indeterminate.  2. Right ventricular systolic function is normal. The right ventricular size is normal. There is normal pulmonary artery systolic pressure.  3. Left atrial size was severely dilated.  4. The mitral valve is normal in structure. No evidence of mitral valve regurgitation. No evidence of mitral stenosis.  5. The aortic valve is tricuspid. Aortic valve regurgitation is not visualized. No aortic stenosis is present.  6. Aortic dilatation noted. There is mild dilatation of the ascending aorta, measuring 40 mm.  7. The inferior vena cava is normal in size with greater than 50% respiratory variability, suggesting right atrial pressure of 3 mmHg. FINDINGS  Left Ventricle: Global hypokinesis wiht akinesis of the basal to mid inferoseptum, inferior and posterolateral myocardium. Left ventricular ejection fraction, by estimation, is 20 to 25%. The left ventricle has severely decreased function. The left ventricle demonstrates regional wall motion abnormalities. Definity contrast agent was given IV to delineate the left ventricular endocardial borders. The left ventricular internal cavity size was normal in size. There is mild concentric left ventricular  hypertrophy. Left ventricular diastolic parameters are indeterminate.  LV Wall Scoring: The inferior wall, posterior wall, mid inferoseptal segment, and basal inferoseptal segment are akinetic. The entire anterior wall, antero-lateral wall, entire anterior septum, and entire apex are hypokinetic. Right Ventricle: The right ventricular size is normal. No increase in right ventricular wall thickness. Right ventricular systolic function is normal. There is normal pulmonary artery systolic pressure. The tricuspid regurgitant  velocity is 1.87 m/s, and  with an assumed right atrial pressure of 3 mmHg, the estimated right ventricular systolic pressure is 17.0 mmHg. Left Atrium: Left atrial size was severely dilated. Right Atrium: Right atrial size was normal in size. Pericardium: There is no evidence of pericardial effusion. Mitral Valve: The mitral valve is normal in structure. No evidence of mitral valve regurgitation. No evidence of mitral valve stenosis. Tricuspid Valve: The tricuspid valve is normal in structure. Tricuspid valve regurgitation is trivial.  No evidence of tricuspid stenosis. Aortic Valve: The aortic valve is tricuspid. Aortic valve regurgitation is not visualized. No aortic stenosis is present. Pulmonic Valve: The pulmonic valve was normal in structure. Pulmonic valve regurgitation is not visualized. No evidence of pulmonic stenosis. Aorta: Aortic dilatation noted. There is mild dilatation of the ascending aorta, measuring 40 mm. Venous: The inferior vena cava is normal in size with greater than 50% respiratory variability, suggesting right atrial pressure of 3 mmHg. IAS/Shunts: No atrial level shunt detected by color flow Doppler.  LEFT VENTRICLE PLAX 2D LVIDd:         5.80 cm LVIDs:         5.30 cm LV PW:         1.20 cm LV IVS:        1.10 cm LVOT diam:     2.40 cm LVOT Area:     4.52 cm  RIGHT VENTRICLE RV Basal diam:  3.20 cm RV Mid diam:    2.80 cm RV S prime:     11.70 cm/s TAPSE (M-mode): 1.7 cm LEFT ATRIUM              Index        RIGHT ATRIUM           Index LA diam:        4.40 cm  1.84 cm/m   RA Area:     16.20 cm LA Vol (A2C):   105.0 ml 43.80 ml/m  RA Volume:   43.70 ml  18.23 ml/m LA Vol (A4C):   99.2 ml  41.38 ml/m LA Biplane Vol: 106.0 ml 44.22 ml/m   AORTA Ao Root diam: 3.60 cm Ao Asc diam:  3.95 cm TRICUSPID VALVE TR Peak grad:   14.0 mmHg TR Vmax:        187.00 cm/s  SHUNTS Systemic Diam: 2.40 cm Annabella Scarce MD Electronically signed by Annabella Scarce MD Signature Date/Time:  04/18/2024/2:58:31 PM    Final    CT Angio Chest Pulmonary Embolism (PE) W or WO Contrast Result Date: 04/18/2024 EXAM: LUNG CANCER SCREENING CHEST CT WITH AND WITHOUT CONTRAST 04/18/2024 05:04:45 AM TECHNIQUE: Low-dose CT of the chest was performed with and without the administration of 75 mL of iohexol (OMNIPAQUE) 350 MG/ML injection. Multiplanar reformatted images are provided for review. Automated exposure control, iterative reconstruction, and/or weight based adjustment of the mA/kV was utilized to reduce the radiation dose to as low as reasonably achievable. COMPARISON: Chest radiographs 04/17/2024. CT abdomen and pelvis 03/28/2017. CLINICAL HISTORY: 63 year old male. Pulmonary embolism (PE) suspected, high probability. FINDINGS: MEDIASTINUM: Borderline cardiomegaly. No pericardial effusion. Little contrast in the aorta. Mild for age calcified aortic atherosclerosis. LYMPH NODES: No mediastinal, hilar or axillary lymphadenopathy. LUNGS AND PLEURA: Good pulmonary artery contrast timing. Minor respiratory motion, which is limited to the lung bases. Central pulmonary arteries are normally enhancing. No convincing distal pulmonary artery filling defect. Small bilateral layering pleural effusions simple fluid density favoring transudate. There is generalized bronchial wall thickening most apparent at the hila. Atelectatic changes to the central airways and bilateral bronchi narrowing. No focal consolidation or pulmonary edema. Asymmetric and dependent pulmonary ground glass opacity more resembles atelectasis than respiratory infection. No pneumothorax. No suspicious pulmonary nodules. SOFT TISSUES AND BONES: Advanced thoracic spine degeneration with superimposed hyperostosis resulting in occasional thoracic vertebral ankylosis. Chronic rib fractures. No acute abnormality of the soft tissues. UPPER ABDOMEN: Non-contrast visible upper abdominal viscera appear stable since 2018, including chronic left upper pole  renal  cyst which appears simple and benign (no follow up imaging recommended). IMPRESSION: 1. No pulmonary embolism identified. 2. Generalized bronchial wall thickening and bilateral bronchial narrowing. No consolidation. Small bilateral layering pleural effusions. Consider acute or chronic Bronchitis, with atelectasis but no convincing pneumonia at this time. Electronically signed by: Helayne Hurst MD 04/18/2024 05:20 AM EST RP Workstation: [id number]    Cardiac Studies     Patient Profile     63 y.o. male with new onset dilated cardiomyopathy.   Assessment & Plan    Acute heart failure with reduced ejection fraction  Substance abuse  Diabetes mellitus  Obstructive sleep apnea   Clinically in heart failure. Agree with IV Lasix for now.  He needs an ischemic evaluation , will plan for tomorrow. Will stop lisinopril and transition to losartan for plans to transition to Entresto Jardiance started yesterday  Will start aldactone hopefully before discharge.  Cr bump to 1.45 today will continue to monitor  Informed Consent   Shared Decision Making/Informed Consent The risks [stroke (1 in 1000), death (1 in 1000), kidney failure [usually temporary] (1 in 500), bleeding (1 in 200), allergic reaction [possibly serious] (1 in 200)], benefits (diagnostic support and management of coronary artery disease) and alternatives of a cardiac catheterization were discussed in detail with Mr. Lotz and he is willing to proceed.       For questions or updates, please contact [facility] Please consult [email or web address] for contact info under Cardiology/STEMI.      Signed, Dawnielle Christiana, DO  04/19/2024, 9:28 AM

## 2024-04-20 ENCOUNTER — Encounter (HOSPITAL_COMMUNITY)
Admission: EM | Disposition: A | Discharge status: Home / Self Care | Payer: Self-pay | Source: Home / Self Care | Attending: Internal Medicine

## 2024-04-20 DIAGNOSIS — I2510 Atherosclerotic heart disease of native coronary artery without angina pectoris: Secondary | ICD-10-CM | POA: Diagnosis not present

## 2024-04-20 DIAGNOSIS — I509 Heart failure, unspecified: Secondary | ICD-10-CM | POA: Diagnosis not present

## 2024-04-20 DIAGNOSIS — I5021 Acute systolic (congestive) heart failure: Secondary | ICD-10-CM | POA: Diagnosis not present

## 2024-04-20 DIAGNOSIS — N179 Acute kidney failure, unspecified: Secondary | ICD-10-CM

## 2024-04-20 LAB — CBC
HCT: 47 % (ref 39.0–52.0)
Hemoglobin: 15.2 g/dL (ref 13.0–17.0)
MCH: 29.5 pg (ref 26.0–34.0)
MCHC: 32.3 g/dL (ref 30.0–36.0)
MCV: 91.3 fL (ref 80.0–100.0)
Platelets: 222 K/uL (ref 150–400)
RBC: 5.15 MIL/uL (ref 4.22–5.81)
RDW: 12.2 % (ref 11.5–15.5)
WBC: 9.6 K/uL (ref 4.0–10.5)
nRBC: 0 % (ref 0.0–0.2)

## 2024-04-20 LAB — POCT I-STAT 7, (LYTES, BLD GAS, ICA,H+H)
Acid-Base Excess: 3 mmol/L — ABNORMAL HIGH (ref 0.0–2.0)
Acid-Base Excess: 7 mmol/L — ABNORMAL HIGH (ref 0.0–2.0)
Bicarbonate: 28.3 mmol/L — ABNORMAL HIGH (ref 20.0–28.0)
Bicarbonate: 34.9 mmol/L — ABNORMAL HIGH (ref 20.0–28.0)
Calcium, Ion: 0.83 mmol/L — CL (ref 1.15–1.40)
Calcium, Ion: 1.18 mmol/L (ref 1.15–1.40)
HCT: 40 % (ref 39.0–52.0)
HCT: 48 % (ref 39.0–52.0)
Hemoglobin: 13.6 g/dL (ref 13.0–17.0)
Hemoglobin: 16.3 g/dL (ref 13.0–17.0)
O2 Saturation: 87 %
O2 Saturation: 69 %
Potassium: 2.9 mmol/L — ABNORMAL LOW (ref 3.5–5.1)
Potassium: 4 mmol/L (ref 3.5–5.1)
Sodium: 145 mmol/L (ref 135–145)
Sodium: 137 mmol/L (ref 135–145)
TCO2: 30 mmol/L (ref 22–32)
TCO2: 37 mmol/L — ABNORMAL HIGH (ref 22–32)
pCO2 arterial: 45.8 mmHg (ref 32–48)
pCO2 arterial: 63 mmHg — ABNORMAL HIGH (ref 32–48)
pH, Arterial: 7.4 (ref 7.35–7.45)
pH, Arterial: 7.352 (ref 7.35–7.45)
pO2, Arterial: 54 mmHg — ABNORMAL LOW (ref 83–108)
pO2, Arterial: 39 mmHg — CL (ref 83–108)

## 2024-04-20 LAB — POCT I-STAT EG7
Acid-Base Excess: 7 mmol/L — ABNORMAL HIGH (ref 0.0–2.0)
Acid-Base Excess: 4 mmol/L — ABNORMAL HIGH (ref 0.0–2.0)
Bicarbonate: 35.4 mmol/L — ABNORMAL HIGH (ref 20.0–28.0)
Bicarbonate: 31.3 mmol/L — ABNORMAL HIGH (ref 20.0–28.0)
Calcium, Ion: 1.2 mmol/L (ref 1.15–1.40)
Calcium, Ion: 0.97 mmol/L — ABNORMAL LOW (ref 1.15–1.40)
HCT: 48 % (ref 39.0–52.0)
HCT: 44 % (ref 39.0–52.0)
Hemoglobin: 16.3 g/dL (ref 13.0–17.0)
Hemoglobin: 15 g/dL (ref 13.0–17.0)
O2 Saturation: 50 %
O2 Saturation: 54 %
Potassium: 3.9 mmol/L (ref 3.5–5.1)
Potassium: 3.3 mmol/L — ABNORMAL LOW (ref 3.5–5.1)
Sodium: 137 mmol/L (ref 135–145)
Sodium: 142 mmol/L (ref 135–145)
TCO2: 37 mmol/L — ABNORMAL HIGH (ref 22–32)
TCO2: 33 mmol/L — ABNORMAL HIGH (ref 22–32)
pCO2, Ven: 63 mmHg — ABNORMAL HIGH (ref 44–60)
pCO2, Ven: 54.2 mmHg (ref 44–60)
pH, Ven: 7.357 (ref 7.25–7.43)
pH, Ven: 7.37 (ref 7.25–7.43)
pO2, Ven: 29 mmHg — CL (ref 32–45)
pO2, Ven: 30 mmHg — CL (ref 32–45)

## 2024-04-20 LAB — BASIC METABOLIC PANEL WITH GFR
Anion gap: 11 (ref 5–15)
BUN: 20 mg/dL (ref 8–23)
CO2: 32 mmol/L (ref 22–32)
Calcium: 9.1 mg/dL (ref 8.9–10.3)
Chloride: 93 mmol/L — ABNORMAL LOW (ref 98–111)
Creatinine, Ser: 1.48 mg/dL — ABNORMAL HIGH (ref 0.61–1.24)
GFR, Estimated: 53 mL/min — ABNORMAL LOW (ref >60)
Glucose, Bld: 169 mg/dL — ABNORMAL HIGH (ref 70–99)
Potassium: 4.1 mmol/L (ref 3.5–5.1)
Sodium: 136 mmol/L (ref 135–145)

## 2024-04-20 LAB — GLUCOSE, CAPILLARY
Glucose-Capillary: 161 mg/dL — ABNORMAL HIGH (ref 70–99)
Glucose-Capillary: 137 mg/dL — ABNORMAL HIGH (ref 70–99)
Glucose-Capillary: 196 mg/dL — ABNORMAL HIGH (ref 70–99)
Glucose-Capillary: 233 mg/dL — ABNORMAL HIGH (ref 70–99)
Glucose-Capillary: 234 mg/dL — ABNORMAL HIGH (ref 70–99)

## 2024-04-20 SURGERY — RIGHT/LEFT HEART CATH AND CORONARY ANGIOGRAPHY
Anesthesia: LOCAL

## 2024-04-20 MED ORDER — LIDOCAINE HCL (PF) 1 % IJ SOLN
INTRAMUSCULAR | Status: DC | PRN
  Administered 2024-04-20 (×2): 3 mL

## 2024-04-20 MED ORDER — ONDANSETRON HCL 4 MG/2ML IJ SOLN: 4.0000 mg | Freq: Four times a day (QID) | INTRAMUSCULAR | Status: DC | PRN

## 2024-04-20 MED ORDER — VERAPAMIL HCL 2.5 MG/ML IV SOLN
INTRAVENOUS | Status: DC | PRN
  Administered 2024-04-20: 10 mL via INTRA_ARTERIAL

## 2024-04-20 MED ORDER — HYDRALAZINE HCL 20 MG/ML IJ SOLN: 10.0000 mg | INTRAMUSCULAR | Status: AC | PRN

## 2024-04-20 MED ORDER — FENTANYL CITRATE (PF) 100 MCG/2ML IJ SOLN
INTRAMUSCULAR | Status: DC | PRN
  Administered 2024-04-20: 25 ug via INTRAVENOUS

## 2024-04-20 MED ORDER — SODIUM CHLORIDE 0.9% FLUSH: 3.0000 mL | INTRAVENOUS | Status: DC | PRN

## 2024-04-20 MED ORDER — LIDOCAINE HCL (PF) 1 % IJ SOLN
INTRAMUSCULAR | Status: AC
  Filled 2024-04-20: qty 30

## 2024-04-20 MED ORDER — ACETAMINOPHEN 325 MG PO TABS: 650.0000 mg | ORAL_TABLET | ORAL | Status: DC | PRN

## 2024-04-20 MED ORDER — SODIUM CHLORIDE 0.9 % IV SOLN: 250.0000 mL | INTRAVENOUS | Status: DC | PRN

## 2024-04-20 MED ORDER — PREGABALIN 25 MG PO CAPS
50.0000 mg | ORAL_CAPSULE | Freq: Three times a day (TID) | ORAL | Status: DC
  Administered 2024-04-20 – 2024-04-24 (×12): 50 mg via ORAL
  Filled 2024-04-20 (×12): qty 2

## 2024-04-20 MED ORDER — HEPARIN SODIUM (PORCINE) 1000 UNIT/ML IJ SOLN
INTRAMUSCULAR | Status: DC | PRN
  Administered 2024-04-20: 6000 [IU] via INTRAVENOUS

## 2024-04-20 MED ORDER — ASPIRIN 81 MG PO CHEW
81.0000 mg | CHEWABLE_TABLET | ORAL | Status: AC
  Administered 2024-04-20: 81 mg via ORAL
  Filled 2024-04-20: qty 1

## 2024-04-20 MED ORDER — SODIUM CHLORIDE 0.9% FLUSH
3.0000 mL | Freq: Two times a day (BID) | INTRAVENOUS | Status: DC
  Administered 2024-04-21 – 2024-04-24 (×6): 3 mL via INTRAVENOUS

## 2024-04-20 MED ORDER — FENTANYL CITRATE (PF) 100 MCG/2ML IJ SOLN
INTRAMUSCULAR | Status: AC
  Filled 2024-04-20: qty 2

## 2024-04-20 MED ORDER — ROSUVASTATIN CALCIUM 20 MG PO TABS
20.0000 mg | ORAL_TABLET | Freq: Every day | ORAL | Status: DC
  Administered 2024-04-20 – 2024-04-22 (×3): 20 mg via ORAL
  Filled 2024-04-20 (×3): qty 1

## 2024-04-20 MED ORDER — SODIUM CHLORIDE 0.9% FLUSH: 3.0000 mL | Freq: Two times a day (BID) | INTRAVENOUS | Status: DC

## 2024-04-20 MED ORDER — HEPARIN SODIUM (PORCINE) 1000 UNIT/ML IJ SOLN
INTRAMUSCULAR | Status: AC
  Filled 2024-04-20: qty 10

## 2024-04-20 MED ORDER — MIDAZOLAM HCL (PF) 2 MG/2ML IJ SOLN
INTRAMUSCULAR | Status: DC | PRN
  Administered 2024-04-20: 1 mg via INTRAVENOUS

## 2024-04-20 MED ORDER — VERAPAMIL HCL 2.5 MG/ML IV SOLN
INTRAVENOUS | Status: AC
  Filled 2024-04-20: qty 2

## 2024-04-20 MED ORDER — MIDAZOLAM HCL 2 MG/2ML IJ SOLN
INTRAMUSCULAR | Status: AC
Start: 2024-04-20 — End: 2024-04-20
  Filled 2024-04-20: qty 2

## 2024-04-20 MED ORDER — SODIUM CHLORIDE 0.9 % IV SOLN: 250.0000 mL | INTRAVENOUS | Status: AC | PRN

## 2024-04-20 MED ORDER — HEPARIN (PORCINE) IN NACL 1000-0.9 UT/500ML-% IV SOLN
INTRAVENOUS | Status: DC | PRN
  Administered 2024-04-20 (×2): 500 mL

## 2024-04-20 SURGICAL SUPPLY — 10 items
CATH 5FR JL3.5 JR4 ANG PIG MP (CATHETERS) IMPLANT
CATH INFINITI 5 FR 3DRC (CATHETERS) IMPLANT
CATH SWAN GANZ 7F STRAIGHT (CATHETERS) IMPLANT
DEVICE RAD COMP TR BAND LRG (VASCULAR PRODUCTS) IMPLANT
GLIDESHEATH SLEND SS 6F .021 (SHEATH) IMPLANT
GLIDESHEATH SLENDER 7FR .021G (SHEATH) IMPLANT
GUIDEWIRE INQWIRE 1.5J.035X260 (WIRE) IMPLANT
KIT HEART LEFT (KITS) IMPLANT
PACK CARDIAC CATHETERIZATION (CUSTOM PROCEDURE TRAY) ×1 IMPLANT
TRANSDUCER W/STOPCOCK (MISCELLANEOUS) IMPLANT

## 2024-04-20 NOTE — Progress Notes (Signed)
 Heart Failure Navigator Progress Note  Assessed for Heart & Vascular TOC clinic readiness.  Patient does not meet criteria due to Advanced Heart Failure Team was consulted with Dr. Bensimhon. .   Navigator will sign off at this time.   Stephane Haddock, BSN, Scientist, clinical (histocompatibility and immunogenetics) Only

## 2024-04-20 NOTE — Plan of Care (Signed)
   Problem: Health Behavior/Discharge Planning: Goal: Ability to manage health-related needs will improve Outcome: Progressing

## 2024-04-20 NOTE — Consult Note (Addendum)
 Advanced Heart Failure Team Consult Note   Primary Physician: [facility], [address] Cardiologist:  None Reason for Consultation: Acute HFrEF HPI:    Charles Nunez is seen today for evaluation of acute HFrEF at the request of Dr. Sheena.   Charles Nunez is a 63 y.o. male with history of HTN, T2DM, OSA, tobacco use, and social cocaine use. No prior HF history. No family history of heart failure that is he aware of. Is on disability for back issues.   Presented to [facility] 12/16/23 with shortness of breath and cough. On admission he was tachycardiac and hypertensive. ECG showed ST 106 bpm. CXR showed pulm edema with hilar lymphadenopathy. CT chest negative for PE, and with bronchial narrowing and small pleural effusions concerning for acute vs chronic bronchitis. Echo showed EF 20-25%, RWMAs, nl RV function, no MR, collapsible IVC. Cardiology was consulted. He was diuresed to IV Lasix, but then developed AKI leading to cancellation of heart cath. AHF was consulted.  Today, feeling much better. No shortness of breath. No chest pain.   Labs in ED reviewed: K 4.6, Na 137, BUN/Cr 12/1.15, hs-trop 58>69, BNP 613, WBC 11, TSH 1.2, UDS + cocaine.   Home Medications Prior to Admission medications   Medication Sig Start Date End Date Taking? Authorizing Provider  acetaminophen  (TYLENOL ) 650 MG CR tablet Take 650 mg by mouth every 8 (eight) hours as needed for pain.   Yes [provider]  citalopram (CELEXA) 20 MG tablet Take 20 mg by mouth daily. 05/28/21  Yes [provider]  FARXIGA 10 MG TABS tablet Take 10 mg by mouth every morning. 06/24/21  Yes [provider]  glipiZIDE (GLUCOTROL XL) 10 MG 24 hr tablet Take 10 mg by mouth every morning. 06/24/21  Yes [provider]  lisinopril (ZESTRIL) 40 MG tablet Take 40 mg by mouth daily. 06/25/21  Yes [provider]  pregabalin (LYRICA) 100 MG capsule Take 100 mg by mouth 3 (three) times daily.   Yes [provider]  eszopiclone  (LUNESTA ) 2 MG TABS tablet Take 1 tablet (2 mg total) by mouth at bedtime as needed for sleep. Take immediately before bedtime Patient not taking: Reported on 04/17/2024 07/01/21   Neda Jennet LABOR, MD  HYDROcodone -acetaminophen  (NORCO/VICODIN) 5-325 MG tablet Take 1 tablet by mouth every 4 (four) hours as needed. Patient not taking: Reported on 04/17/2024 01/20/24   Freddi Hamilton, MD    Past Medical History: Past Medical History:  Diagnosis Date   Back pain    Diabetes mellitus without complication [facility])     Past Surgical History: Past Surgical History:  Procedure Laterality Date   BACK SURGERY      Family History: Family History  Problem Relation Age of Onset   Diabetes Mother    Diabetes Father    Alpha-1 antitrypsin deficiency Neg Hx    Lung cancer Neg Hx     Social History: Social History   Socioeconomic History   Marital status: Married    Spouse name: Not on file   Number of children: Not on file   Years of education: Not on file   Highest education level: Not on file  Occupational History   Not on file  Tobacco Use   Smoking status: Every Day    Current packs/day: 0.25    Average packs/day: 0.3 packs/day for 45.8 years (11.5 ttl pk-yrs)    Types: Cigarettes    Start date: 06/23/1978   Smokeless tobacco: Never  Substance and  Sexual Activity   Alcohol use: No   Drug use: Not Currently   Sexual activity: Not on file  Other Topics Concern   Not on file  Social History Narrative   Not on file   Social Drivers of Health   Financial Resource Strain: Not on file  Food Insecurity: No Food Insecurity (04/17/2024)   Hunger Vital Sign    Worried About Running Out of Food in the Last Year: Never true    Ran Out of Food in the Last Year: Never true  Transportation Needs: No Transportation Needs (04/17/2024)   PRAPARE - Administrator, Civil Service (Medical): No    Lack of Transportation (Non-Medical): No  Physical  Activity: Not on file  Stress: Not on file  Social Connections: Not on file    Allergies:  Allergies  Allergen Reactions   Vancomycin Other (See Comments)    Other reaction(s): Red Man Syndrome (ALLERGY), Redness Red man syndrome Per patient.  Patient denies anaphylaxis as mentioned in OSH records     Objective:    Vital Signs:   Temp:  [97.6 F (36.4 C)-98.4 F (36.9 C)] 98.4 F (36.9 C) (11/07 1102) Pulse Rate:  [67-103] 67 (11/07 1102) Resp:  [17-20] 20 (11/07 1102) BP: (92-143)/(4-89) 114/84 (11/07 1102) SpO2:  [90 %-97 %] 94 % (11/07 1102) Weight:  [125.9 kg] 125.9 kg (11/07 0324) Last BM Date : 04/20/24  Weight change: Filed Weights   04/18/24 0500 04/19/24 0455 04/20/24 0324  Weight: 125.6 kg 125.6 kg 125.9 kg   Intake/Output:  Intake/Output Summary (Last 24 hours) at 04/20/2024 1133 Last data filed at 04/20/2024 0942 Gross per 24 hour  Intake 1191 ml  Output 1625 ml  Net -434 ml    Physical Exam    General: Obese appearing. No distress on RA Cardiac: JVP flat. S1 and S2 present. No murmurs Resp: Lung sounds clear and equal B/L Abdomen: Soft, non-tender, distended.  Extremities: Warm and dry.  No edema.  Neuro: Alert and oriented x3. Affect pleasant. Moves all extremities without difficulty.  Telemetry   ST 100s (personally reviewed)  Labs   Basic Metabolic Panel: Recent Labs  Lab 04/17/24 0712 04/18/24 0044 04/18/24 0336 04/19/24 0259 04/20/24 0324  NA 137 136 137 137 136  K 4.6 4.2 4.0 4.4 4.1  CL 102 93* 94* 94* 93*  CO2 29 30 32 33* 32  GLUCOSE 175* 205* 159* 185* 169*  BUN 11 12 11 16 20   CREATININE 1.00 1.15 1.04 1.45* 1.48*  CALCIUM 10.0 9.7 9.5 9.7 9.1  MG  --  1.7 1.8  --   --   PHOS  --  2.3* 2.5  --   --     Liver Function Tests: Recent Labs  Lab 04/18/24 0044 04/18/24 0336  AST 27 23  ALT 23 22  ALKPHOS 95 90  BILITOT 0.7 1.0  PROT 6.5 6.5  ALBUMIN 3.3* 3.2*   No results for input(s): LIPASE, AMYLASE in the  last 168 hours. No results for input(s): AMMONIA in the last 168 hours.  CBC: Recent Labs  Lab 04/17/24 0712 04/18/24 0044 04/18/24 0336 04/19/24 0259 04/20/24 0324  WBC 10.9* 10.4 9.7 9.9 9.6  NEUTROABS  --  6.0  --   --   --   HGB 15.6 15.7 15.5 16.0 15.2  HCT 48.7 48.1 47.7 49.9 47.0  MCV 92.6 91.4 90.3 91.2 91.3  PLT 227 243 228 245 222   ProBNP (last 3 results)  Recent Labs    04/17/24 0712  PROBNP 613.0*   CBG: Recent Labs  Lab 04/19/24 1205 04/19/24 1656 04/19/24 2047 04/20/24 0634 04/20/24 1059  GLUCAP 192* 157* 161* 137* 196*   Medications:    Current Medications:  aspirin EC  81 mg Oral Daily   empagliflozin  10 mg Oral Daily   enoxaparin (LOVENOX) injection  60 mg Subcutaneous Daily   furosemide  40 mg Intravenous Q12H   guaiFENesin  600 mg Oral BID   insulin  aspart  0-15 Units Subcutaneous TID WC   losartan  50 mg Oral Daily   nicotine  14 mg Transdermal Daily   pregabalin  50 mg Oral TID   sodium chloride flush  3 mL Intravenous Q12H    Infusions:  sodium chloride 10 mL/hr at 04/20/24 0608    Assessment/Plan   Acute Systolic Heart Failure - new diagnosis. Echo with EF 20-25%, RWMAs, and nl RV - etiology unknown. Suspect HTN CM may be playing a role. No ETOH, reports cocaine use but not often. Denies family history of HF. RWMA on echo concerning for possible ICM. Will need CMR [facility] for ischemic eval today - AKI with diuresis, IVC collapsible on echo; suspect may be dry - hold diuretics - continue jardiance 10 mg daily - continue losartan 50 mg daily, plan to switch to Entresto once ACE washed - add spiro 12.5 mg tomorrow if Cr ok post cath - R/LHC today; possibly CMR as early as this afternoon  CAD - suspect; cor angio today - lipid panel and lipo (a) with am labs - start statin; on aspirin  AKI - baseline Cr normal - up to 1.48 today - hold diuresis  HTN - GDMT as above  OSA - noncompliant with CPAP  Cocaine use -  discussed cessation  Length of Stay: 2  Jordan Lee, NP  04/20/2024, 11:33 AM  Advanced Heart Failure Team Pager [phone] (M-F; 7a - 5p)  Please contact [facility] for night-coverage after hours (4p -7a ) and weekends on [email or web address]  Patient seen and examined with the above-signed Advanced Practice Provider and/or Housestaff. I personally reviewed laboratory data, imaging studies and relevant notes. I independently examined the patient and formulated the important aspects of the plan. I have edited the note to reflect any of my changes or salient points. I have personally discussed the plan with the patient and/or family.  63 y/o male with obesity, HTN, OSA, CKD 3, cocaine use admitted with acute systolic HF EF 20-25%  Feels better after diuresis. Denies acute CP   General:  Obese male sitting in chair No resp difficulty HEENT: normal Neck: supple. no JVD.  Cor: Regular rate & rhythm. No rubs, gallops or murmurs. Lungs: clear Abdomen: sobese oft, nontender, nondistended.Good bowel sounds. Extremities: no cyanosis, clubbing, rash, edema Neuro: alert & orientedx3, cranial nerves grossly intact. moves all 4 extremities w/o difficulty. Affect pleasant  He has new onset HF with severely reduced EF and multiple CRFs. Plan R/LHC today to further assess. He is agreeable.   Titrate GDMT as tolerated. If cath unrevealing will need cMRI.   Discussed need to abstain from substance abuse.  Toribio Fuel, MD  9:59 PM

## 2024-04-20 NOTE — Interval H&P Note (Signed)
 History and Physical Interval Note:  04/20/2024 11:35 AM  Charles Nunez  has presented today for surgery, with the diagnosis of heart failure.  The various methods of treatment have been discussed with the patient and family. After consideration of risks, benefits and other options for treatment, the patient has consented to  Procedure(s): RIGHT/LEFT HEART CATH AND CORONARY ANGIOGRAPHY (N/A) as a surgical intervention.  The patient's history has been reviewed, patient examined, no change in status, stable for surgery.  I have reviewed the patient's chart and labs.  Questions were answered to the patient's satisfaction.     [name]

## 2024-04-20 NOTE — Progress Notes (Signed)
 PROGRESS NOTE    Charles Nunez  FMW:[id number] DOB: 1960/08/20 DOA: 04/17/2024 PCP: [facility], [address]   Brief Narrative:   Charles Nunez is a 63 y.o. male with PMH of  Dm2, HTN OSA, active tobacco abuse who presented with increased shortness of breath, cough,wheezing and increased work of breathing since 04/16/24. CT angio chest negative for PE or pneumonia, findings of acute bronchitis. Echocardiogram shows LVEF 20 to 25%.  Patient receiving diuresis with IV Lasix.  Plan for right and left heart catheterization per cardiology.   Assessment & Plan:   Principal Problem:   Acute clinical systolic heart failure (HCC) Active Problems:   Essential hypertension   Hypercholesterolemia   OSA (obstructive sleep apnea)   Uncontrolled type 2 diabetes mellitus with hyperglycemia, with long-term current use of insulin  (HCC)   Morbid obesity with BMI of 40.0-44.9, adult (HCC)   Tachycardia    Acute on chronic diastolic CHF Elevated BNP likely demand ischemia: Presented w/ shortness of breath cough and abnormal BNP 624m, trop up-flat chest x-ray/CTA no PE, question bronchitis Echocardiogram shows LVEF 20 to 25%, regional wall motion abnormalities, normal RV function. Continue IV diuresis and Monitor strict I/O,daly weight, electrolytes, Cont salt and fluid restricted diet. Cardiology following, plan for right/left heart cath today. GDMT: Continue losartan 50 mg daily, Jardiance 10 mg daily. Slow rise in creatinine to 1.5.  Will monitor intake and output, daily labs.  Hypercholesterolemia: Not on meds.   HTN: Borderline, resume on lisinopril   DM 2: PTA on  PO meds only.  Continue SSI for now, CBG/ A1c as below.  OSA: noncompliant with CPAP    Class II w/ Body mass index is 39.75 kg/m.: Will benefit with PCP follow-up, weight loss,healthy lifestyle and compliance  w/ CPAP   DVT prophylaxis: SCDs Start: 04/17/24 2344 add Lovenox Code Status:   Code Status: Full Code Family  Communication: plan of care discussed with patient at bedside. Patient status is: Remains hospitalized because of severity of illness Level of care: Telemetry    Dispo: The patient is from: HOME            Anticipated disposition: TBD  Subjective:  Patient seen and examined at the bedside.  Denies chest pain or shortness of breath.  Vital signs are stable. Objective: Vitals:   04/20/24 1353 04/20/24 1358 04/20/24 1403 04/20/24 1408  BP: 117/81 116/82 (!) 114/92   Pulse: 93 92 96   Resp: 15 17    Temp:      TempSrc:      SpO2: 94% 97% 95% (!) 89%  Weight:      Height:        Intake/Output Summary (Last 24 hours) at 04/20/2024 1430 Last data filed at 04/20/2024 0942 Gross per 24 hour  Intake 717 ml  Output 1625 ml  Net -908 ml   Filed Weights   04/18/24 0500 04/19/24 0455 04/20/24 0324  Weight: 125.6 kg 125.6 kg 125.9 kg    Examination:  General exam: Appears calm and comfortable  Respiratory system: Bilateral decreased breath sounds at bases, mild crackles bilaterally Cardiovascular system: S1 & S2 heard, Rate controlled Gastrointestinal system: Abdomen is mildly distended, soft and nontender. Normal bowel sounds heard. Extremities: Mild lower extremity edema bilaterally Central nervous system: Alert and oriented. No focal neurological deficits. Moving extremities Skin: No rashes, lesions or ulcers Psychiatry: Judgement and insight appear normal. Mood & affect appropriate.     Data Reviewed: I have personally reviewed following labs and imaging studies  CBC: Recent Labs  Lab 04/17/24 0712 04/18/24 0044 04/18/24 0336 04/19/24 0259 04/20/24 0324  WBC 10.9* 10.4 9.7 9.9 9.6  NEUTROABS  --  6.0  --   --   --   HGB 15.6 15.7 15.5 16.0 15.2  HCT 48.7 48.1 47.7 49.9 47.0  MCV 92.6 91.4 90.3 91.2 91.3  PLT 227 243 228 245 222   Basic Metabolic Panel: Recent Labs  Lab 04/17/24 0712 04/18/24 0044 04/18/24 0336 04/19/24 0259 04/20/24 0324  NA 137 136 137 137  136  K 4.6 4.2 4.0 4.4 4.1  CL 102 93* 94* 94* 93*  CO2 29 30 32 33* 32  GLUCOSE 175* 205* 159* 185* 169*  BUN 11 12 11 16 20   CREATININE 1.00 1.15 1.04 1.45* 1.48*  CALCIUM 10.0 9.7 9.5 9.7 9.1  MG  --  1.7 1.8  --   --   PHOS  --  2.3* 2.5  --   --    GFR: Estimated Creatinine Clearance: 68.1 mL/min (A) (by C-G formula based on SCr of 1.48 mg/dL (H)). Liver Function Tests: Recent Labs  Lab 04/18/24 0044 04/18/24 0336  AST 27 23  ALT 23 22  ALKPHOS 95 90  BILITOT 0.7 1.0  PROT 6.5 6.5  ALBUMIN 3.3* 3.2*   No results for input(s): LIPASE, AMYLASE in the last 168 hours. No results for input(s): AMMONIA in the last 168 hours. Coagulation Profile: No results for input(s): INR, PROTIME in the last 168 hours. Cardiac Enzymes: No results for input(s): CKTOTAL, CKMB, CKMBINDEX, TROPONINI in the last 168 hours. BNP (last 3 results) Recent Labs    04/17/24 0712  PROBNP 613.0*   HbA1C: Recent Labs    04/18/24 0044  HGBA1C 7.8*   CBG: Recent Labs  Lab 04/19/24 1205 04/19/24 1656 04/19/24 2047 04/20/24 0634 04/20/24 1059  GLUCAP 192* 157* 161* 137* 196*   Lipid Profile: No results for input(s): CHOL, HDL, LDLCALC, TRIG, CHOLHDL, LDLDIRECT in the last 72 hours. Thyroid  Function Tests: Recent Labs    04/18/24 0044  TSH 1.260   Anemia Panel: No results for input(s): VITAMINB12, FOLATE, FERRITIN, TIBC, IRON, RETICCTPCT in the last 72 hours. Sepsis Labs: No results for input(s): PROCALCITON, LATICACIDVEN in the last 168 hours.  Recent Results (from the past 240 hours)  Resp panel by RT-PCR (RSV, Flu A&B, Covid) Anterior Nasal Swab     Status: None   Collection Time: 04/17/24  7:29 AM   Specimen: Anterior Nasal Swab  Result Value Ref Range Status   SARS Coronavirus 2 by RT PCR NEGATIVE NEGATIVE Final    Comment: (NOTE) SARS-CoV-2 target nucleic acids are NOT DETECTED.  The SARS-CoV-2 RNA is generally detectable in  upper respiratory specimens during the acute phase of infection. The lowest concentration of SARS-CoV-2 viral copies this assay can detect is 138 copies/mL. A negative result does not preclude SARS-Cov-2 infection and should not be used as the sole basis for treatment or other patient management decisions. A negative result may occur with  improper specimen collection/handling, submission of specimen other than nasopharyngeal swab, presence of viral mutation(s) within the areas targeted by this assay, and inadequate number of viral copies(<138 copies/mL). A negative result must be combined with clinical observations, patient history, and epidemiological information. The expected result is Negative.  Fact Sheet for Patients:  bloggercourse.com  Fact Sheet for Healthcare Providers:  seriousbroker.it  This test is no t yet approved or cleared by the [place]  FDA and  has been  authorized for detection and/or diagnosis of SARS-CoV-2 by FDA under an Emergency Use Authorization (EUA). This EUA will remain  in effect (meaning this test can be used) for the duration of the COVID-19 declaration under Section 564(b)(1) of the Act, 21 U.S.C.section 360bbb-3(b)(1), unless the authorization is terminated  or revoked sooner.       Influenza A by PCR NEGATIVE NEGATIVE Final   Influenza B by PCR NEGATIVE NEGATIVE Final    Comment: (NOTE) The Xpert Xpress SARS-CoV-2/FLU/RSV plus assay is intended as an aid in the diagnosis of influenza from Nasopharyngeal swab specimens and should not be used as a sole basis for treatment. Nasal washings and aspirates are unacceptable for Xpert Xpress SARS-CoV-2/FLU/RSV testing.  Fact Sheet for Patients: bloggercourse.com  Fact Sheet for Healthcare Providers: seriousbroker.it  This test is not yet approved or cleared by the [place]  FDA and has been  authorized for detection and/or diagnosis of SARS-CoV-2 by FDA under an Emergency Use Authorization (EUA). This EUA will remain in effect (meaning this test can be used) for the duration of the COVID-19 declaration under Section 564(b)(1) of the Act, 21 U.S.C. section 360bbb-3(b)(1), unless the authorization is terminated or revoked.     Resp Syncytial Virus by PCR NEGATIVE NEGATIVE Final    Comment: (NOTE) Fact Sheet for Patients: bloggercourse.com  Fact Sheet for Healthcare Providers: seriousbroker.it  This test is not yet approved or cleared by the [place]  FDA and has been authorized for detection and/or diagnosis of SARS-CoV-2 by FDA under an Emergency Use Authorization (EUA). This EUA will remain in effect (meaning this test can be used) for the duration of the COVID-19 declaration under Section 564(b)(1) of the Act, 21 U.S.C. section 360bbb-3(b)(1), unless the authorization is terminated or revoked.  Performed at [facility], [address], [place], [place] [address]          Radiology Studies: CARDIAC CATHETERIZATION Result Date: 04/20/2024   The left ventricular ejection fraction is less than 25% by visual estimate. Findings: Ao = 101/87 (94) LV = 103/17 RA =  7 RV = 39/10 PA = 40/28 (32) PCW = 17 Fick cardiac output/index = 4.5/1.9 Thermo CO/CI = 5.7/2.4 PVR = 3.0 WU Ao sat = 87% PA sat = 52% PAPi = 1.6 Assessment: 1. Left-dominant coronary system with no CAD 2. 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 Well compensated filling pressures with moderate to severely depressed co due to biventricular failure 4. Likely OHS Plan/Discussion: He has severe NICM. With low output. Titrate GDMT. Add digoxin. Check cMRI. Toribio Fuel, MD 2:14 PM       Scheduled Meds:  aspirin EC  81 mg Oral Daily   empagliflozin  10 mg Oral Daily   enoxaparin (LOVENOX) injection  60 mg Subcutaneous Daily   furosemide  40 mg  Intravenous Q12H   guaiFENesin  600 mg Oral BID   insulin  aspart  0-15 Units Subcutaneous TID WC   losartan  50 mg Oral Daily   nicotine  14 mg Transdermal Daily   pregabalin  50 mg Oral TID   rosuvastatin  20 mg Oral Daily   sodium chloride flush  3 mL Intravenous Q12H   Continuous Infusions:        Derryl Duval, MD [facility] 04/20/2024, 2:30 PM

## 2024-04-20 NOTE — Progress Notes (Signed)
 Progress Note  Patient Name: Charles Nunez Date of Encounter: 04/20/2024  Primary Cardiologist: None   Subjective   Patient seen and examined at his bedside.   Inpatient Medications    Scheduled Meds:  aspirin EC  81 mg Oral Daily   empagliflozin  10 mg Oral Daily   enoxaparin (LOVENOX) injection  60 mg Subcutaneous Daily   furosemide  40 mg Intravenous Q12H   guaiFENesin  600 mg Oral BID   insulin  aspart  0-15 Units Subcutaneous TID WC   losartan  50 mg Oral Daily   nicotine  14 mg Transdermal Daily   pregabalin  50 mg Oral TID   sodium chloride flush  3 mL Intravenous Q12H   Continuous Infusions:  sodium chloride 10 mL/hr at 04/20/24 0608   PRN Meds: acetaminophen  **OR** acetaminophen , albuterol, HYDROcodone -acetaminophen , hydrOXYzine, ondansetron  **OR** ondansetron  (ZOFRAN ) IV, sodium chloride flush   Vital Signs    Vitals:   04/19/24 2055 04/20/24 0140 04/20/24 0324 04/20/24 0752  BP: 92/64 131/84 (!) 143/4 126/89  Pulse: 75 (!) 101 (!) 101 77  Resp: 18 19 17 20   Temp: 98.1 F (36.7 C) 98 F (36.7 C) 97.6 F (36.4 C) 97.8 F (36.6 C)  TempSrc: Oral Oral Oral Oral  SpO2: 90% 92% 95% 97%  Weight:   125.9 kg   Height:        Intake/Output Summary (Last 24 hours) at 04/20/2024 0932 Last data filed at 04/20/2024 0100 Gross per 24 hour  Intake 1188 ml  Output 1625 ml  Net -437 ml   Filed Weights   04/18/24 0500 04/19/24 0455 04/20/24 0324  Weight: 125.6 kg 125.6 kg 125.9 kg    Telemetry     - Personally Reviewed  ECG     - Personally Reviewed  Physical Exam    General: Comfortable Head: Atraumatic, normal size  Eyes: PEERLA, EOMI  Neck: Supple, normal JVD Cardiac: Normal S1, S2; RRR; no murmurs, rubs, or gallops Lungs: Clear to auscultation bilaterally Abd: Soft, nontender, no hepatomegaly  Ext: warm, no edema Musculoskeletal: No deformities, BUE and BLE strength normal and equal Skin: Warm and dry, no rashes   Neuro: Alert and oriented  to person, place, time, and situation, CNII-XII grossly intact, no focal deficits  Psych: Normal mood and affect   Labs    Chemistry Recent Labs  Lab 04/18/24 0044 04/18/24 0336 04/19/24 0259 04/20/24 0324  NA 136 137 137 136  K 4.2 4.0 4.4 4.1  CL 93* 94* 94* 93*  CO2 30 32 33* 32  GLUCOSE 205* 159* 185* 169*  BUN 12 11 16 20   CREATININE 1.15 1.04 1.45* 1.48*  CALCIUM 9.7 9.5 9.7 9.1  PROT 6.5 6.5  --   --   ALBUMIN 3.3* 3.2*  --   --   AST 27 23  --   --   ALT 23 22  --   --   ALKPHOS 95 90  --   --   BILITOT 0.7 1.0  --   --   GFRNONAA >60 >60 54* 53*  ANIONGAP 13 11 10 11      Hematology Recent Labs  Lab 04/18/24 0336 04/19/24 0259 04/20/24 0324  WBC 9.7 9.9 9.6  RBC 5.28 5.47 5.15  HGB 15.5 16.0 15.2  HCT 47.7 49.9 47.0  MCV 90.3 91.2 91.3  MCH 29.4 29.3 29.5  MCHC 32.5 32.1 32.3  RDW 12.1 12.0 12.2  PLT 228 245 222    Cardiac EnzymesNo results  for input(s): TROPONINI in the last 168 hours. No results for input(s): TROPIPOC in the last 168 hours.   BNP Recent Labs  Lab 04/17/24 0712  PROBNP 613.0*     DDimer  Recent Labs  Lab 04/18/24 0336  DDIMER 0.63*     Radiology    ECHOCARDIOGRAM COMPLETE Result Date: 04/18/2024    ECHOCARDIOGRAM REPORT   Patient Name:   Darric Plante Date of Exam: 04/18/2024 Medical Rec #:  [id number]    Height:       70.0 in Accession #:    [id number]   Weight:       277.0 lb Date of Birth:  18-Dec-1960    BSA:          2.397 m Patient Age:    63 years     BP:           107/62 mmHg Patient Gender: M            HR:           100 bpm. Exam Location:  Inpatient Procedure: 2D Echo and Intracardiac Opacification Agent (Both Spectral and Color            Flow Doppler were utilized during procedure). Indications:    CHF  History:        Patient has no prior history of Echocardiogram examinations.  Sonographer:    Charmaine Gaskins Referring Phys: ANASTASSIA DOUTOVA IMPRESSIONS  1. Global hypokinesis wiht akinesis of the basal to mid  inferoseptum, inferior and posterolateral myocardium. Left ventricular ejection fraction, by estimation, is 20 to 25%. The left ventricle has severely decreased function. The left ventricle demonstrates regional wall motion abnormalities (see scoring diagram/findings for description). There is mild concentric left ventricular hypertrophy. Left ventricular diastolic parameters are indeterminate.  2. Right ventricular systolic function is normal. The right ventricular size is normal. There is normal pulmonary artery systolic pressure.  3. Left atrial size was severely dilated.  4. The mitral valve is normal in structure. No evidence of mitral valve regurgitation. No evidence of mitral stenosis.  5. The aortic valve is tricuspid. Aortic valve regurgitation is not visualized. No aortic stenosis is present.  6. Aortic dilatation noted. There is mild dilatation of the ascending aorta, measuring 40 mm.  7. The inferior vena cava is normal in size with greater than 50% respiratory variability, suggesting right atrial pressure of 3 mmHg. FINDINGS  Left Ventricle: Global hypokinesis wiht akinesis of the basal to mid inferoseptum, inferior and posterolateral myocardium. Left ventricular ejection fraction, by estimation, is 20 to 25%. The left ventricle has severely decreased function. The left ventricle demonstrates regional wall motion abnormalities. Definity contrast agent was given IV to delineate the left ventricular endocardial borders. The left ventricular internal cavity size was normal in size. There is mild concentric left ventricular  hypertrophy. Left ventricular diastolic parameters are indeterminate.  LV Wall Scoring: The inferior wall, posterior wall, mid inferoseptal segment, and basal inferoseptal segment are akinetic. The entire anterior wall, antero-lateral wall, entire anterior septum, and entire apex are hypokinetic. Right Ventricle: The right ventricular size is normal. No increase in right ventricular  wall thickness. Right ventricular systolic function is normal. There is normal pulmonary artery systolic pressure. The tricuspid regurgitant velocity is 1.87 m/s, and  with an assumed right atrial pressure of 3 mmHg, the estimated right ventricular systolic pressure is 17.0 mmHg. Left Atrium: Left atrial size was severely dilated. Right Atrium: Right atrial size was normal in size. Pericardium: There is  no evidence of pericardial effusion. Mitral Valve: The mitral valve is normal in structure. No evidence of mitral valve regurgitation. No evidence of mitral valve stenosis. Tricuspid Valve: The tricuspid valve is normal in structure. Tricuspid valve regurgitation is trivial. No evidence of tricuspid stenosis. Aortic Valve: The aortic valve is tricuspid. Aortic valve regurgitation is not visualized. No aortic stenosis is present. Pulmonic Valve: The pulmonic valve was normal in structure. Pulmonic valve regurgitation is not visualized. No evidence of pulmonic stenosis. Aorta: Aortic dilatation noted. There is mild dilatation of the ascending aorta, measuring 40 mm. Venous: The inferior vena cava is normal in size with greater than 50% respiratory variability, suggesting right atrial pressure of 3 mmHg. IAS/Shunts: No atrial level shunt detected by color flow Doppler.  LEFT VENTRICLE PLAX 2D LVIDd:         5.80 cm LVIDs:         5.30 cm LV PW:         1.20 cm LV IVS:        1.10 cm LVOT diam:     2.40 cm LVOT Area:     4.52 cm  RIGHT VENTRICLE RV Basal diam:  3.20 cm RV Mid diam:    2.80 cm RV S prime:     11.70 cm/s TAPSE (M-mode): 1.7 cm LEFT ATRIUM              Index        RIGHT ATRIUM           Index LA diam:        4.40 cm  1.84 cm/m   RA Area:     16.20 cm LA Vol (A2C):   105.0 ml 43.80 ml/m  RA Volume:   43.70 ml  18.23 ml/m LA Vol (A4C):   99.2 ml  41.38 ml/m LA Biplane Vol: 106.0 ml 44.22 ml/m   AORTA Ao Root diam: 3.60 cm Ao Asc diam:  3.95 cm TRICUSPID VALVE TR Peak grad:   14.0 mmHg TR Vmax:         187.00 cm/s  SHUNTS Systemic Diam: 2.40 cm Annabella Scarce MD Electronically signed by Annabella Scarce MD Signature Date/Time: 04/18/2024/2:58:31 PM    Final     Cardiac Studies   Echo   Patient Profile     63 y.o. male with new onset dilated cardiomyopathy.   Assessment & Plan    Acute heart failure with reduced ejection fraction  Substance abuse  Diabetes mellitus  Obstructive sleep apnea   Clinically in heart failure. Cr worsening but clinically still appears to have some volume and urine output minimal. Concern for low output heart failure will get our advanced heart failure team to see him today.  Will postpone his cardiac catheterization.   Continue current GDMT, on losartan for plans to transition to Entresto, Jardiance.   OSA - uses cpap  DM - on Jardiance, SSI per primary team    For questions or updates, please contact [facility] Please consult [email or web address] for contact info under Cardiology/STEMI.      Signed, Mechele Kittleson, DO  04/20/2024, 9:32 AM

## 2024-04-20 NOTE — H&P (View-Only) (Signed)
 Progress Note  Patient Name: Tammy Ericsson Date of Encounter: 04/20/2024  Primary Cardiologist: None   Subjective   Patient seen and examined at his bedside.   Inpatient Medications    Scheduled Meds:  aspirin EC  81 mg Oral Daily   empagliflozin  10 mg Oral Daily   enoxaparin (LOVENOX) injection  60 mg Subcutaneous Daily   furosemide  40 mg Intravenous Q12H   guaiFENesin  600 mg Oral BID   insulin  aspart  0-15 Units Subcutaneous TID WC   losartan  50 mg Oral Daily   nicotine  14 mg Transdermal Daily   pregabalin  50 mg Oral TID   sodium chloride flush  3 mL Intravenous Q12H   Continuous Infusions:  sodium chloride 10 mL/hr at 04/20/24 0608   PRN Meds: acetaminophen  **OR** acetaminophen , albuterol, HYDROcodone -acetaminophen , hydrOXYzine, ondansetron  **OR** ondansetron  (ZOFRAN ) IV, sodium chloride flush   Vital Signs    Vitals:   04/19/24 2055 04/20/24 0140 04/20/24 0324 04/20/24 0752  BP: 92/64 131/84 (!) 143/4 126/89  Pulse: 75 (!) 101 (!) 101 77  Resp: 18 19 17 20   Temp: 98.1 F (36.7 C) 98 F (36.7 C) 97.6 F (36.4 C) 97.8 F (36.6 C)  TempSrc: Oral Oral Oral Oral  SpO2: 90% 92% 95% 97%  Weight:   125.9 kg   Height:        Intake/Output Summary (Last 24 hours) at 04/20/2024 0932 Last data filed at 04/20/2024 0100 Gross per 24 hour  Intake 1188 ml  Output 1625 ml  Net -437 ml   Filed Weights   04/18/24 0500 04/19/24 0455 04/20/24 0324  Weight: 125.6 kg 125.6 kg 125.9 kg    Telemetry     - Personally Reviewed  ECG     - Personally Reviewed  Physical Exam    General: Comfortable Head: Atraumatic, normal size  Eyes: PEERLA, EOMI  Neck: Supple, normal JVD Cardiac: Normal S1, S2; RRR; no murmurs, rubs, or gallops Lungs: Clear to auscultation bilaterally Abd: Soft, nontender, no hepatomegaly  Ext: warm, no edema Musculoskeletal: No deformities, BUE and BLE strength normal and equal Skin: Warm and dry, no rashes   Neuro: Alert and oriented  to person, place, time, and situation, CNII-XII grossly intact, no focal deficits  Psych: Normal mood and affect   Labs    Chemistry Recent Labs  Lab 04/18/24 0044 04/18/24 0336 04/19/24 0259 04/20/24 0324  NA 136 137 137 136  K 4.2 4.0 4.4 4.1  CL 93* 94* 94* 93*  CO2 30 32 33* 32  GLUCOSE 205* 159* 185* 169*  BUN 12 11 16 20   CREATININE 1.15 1.04 1.45* 1.48*  CALCIUM 9.7 9.5 9.7 9.1  PROT 6.5 6.5  --   --   ALBUMIN 3.3* 3.2*  --   --   AST 27 23  --   --   ALT 23 22  --   --   ALKPHOS 95 90  --   --   BILITOT 0.7 1.0  --   --   GFRNONAA >60 >60 54* 53*  ANIONGAP 13 11 10 11      Hematology Recent Labs  Lab 04/18/24 0336 04/19/24 0259 04/20/24 0324  WBC 9.7 9.9 9.6  RBC 5.28 5.47 5.15  HGB 15.5 16.0 15.2  HCT 47.7 49.9 47.0  MCV 90.3 91.2 91.3  MCH 29.4 29.3 29.5  MCHC 32.5 32.1 32.3  RDW 12.1 12.0 12.2  PLT 228 245 222    Cardiac EnzymesNo results  for input(s): TROPONINI in the last 168 hours. No results for input(s): TROPIPOC in the last 168 hours.   BNP Recent Labs  Lab 04/17/24 0712  PROBNP 613.0*     DDimer  Recent Labs  Lab 04/18/24 0336  DDIMER 0.63*     Radiology    ECHOCARDIOGRAM COMPLETE Result Date: 04/18/2024    ECHOCARDIOGRAM REPORT   Patient Name:   Darric Plante Date of Exam: 04/18/2024 Medical Rec #:  [id number]    Height:       70.0 in Accession #:    [id number]   Weight:       277.0 lb Date of Birth:  18-Dec-1960    BSA:          2.397 m Patient Age:    63 years     BP:           107/62 mmHg Patient Gender: M            HR:           100 bpm. Exam Location:  Inpatient Procedure: 2D Echo and Intracardiac Opacification Agent (Both Spectral and Color            Flow Doppler were utilized during procedure). Indications:    CHF  History:        Patient has no prior history of Echocardiogram examinations.  Sonographer:    Charmaine Gaskins Referring Phys: ANASTASSIA DOUTOVA IMPRESSIONS  1. Global hypokinesis wiht akinesis of the basal to mid  inferoseptum, inferior and posterolateral myocardium. Left ventricular ejection fraction, by estimation, is 20 to 25%. The left ventricle has severely decreased function. The left ventricle demonstrates regional wall motion abnormalities (see scoring diagram/findings for description). There is mild concentric left ventricular hypertrophy. Left ventricular diastolic parameters are indeterminate.  2. Right ventricular systolic function is normal. The right ventricular size is normal. There is normal pulmonary artery systolic pressure.  3. Left atrial size was severely dilated.  4. The mitral valve is normal in structure. No evidence of mitral valve regurgitation. No evidence of mitral stenosis.  5. The aortic valve is tricuspid. Aortic valve regurgitation is not visualized. No aortic stenosis is present.  6. Aortic dilatation noted. There is mild dilatation of the ascending aorta, measuring 40 mm.  7. The inferior vena cava is normal in size with greater than 50% respiratory variability, suggesting right atrial pressure of 3 mmHg. FINDINGS  Left Ventricle: Global hypokinesis wiht akinesis of the basal to mid inferoseptum, inferior and posterolateral myocardium. Left ventricular ejection fraction, by estimation, is 20 to 25%. The left ventricle has severely decreased function. The left ventricle demonstrates regional wall motion abnormalities. Definity contrast agent was given IV to delineate the left ventricular endocardial borders. The left ventricular internal cavity size was normal in size. There is mild concentric left ventricular  hypertrophy. Left ventricular diastolic parameters are indeterminate.  LV Wall Scoring: The inferior wall, posterior wall, mid inferoseptal segment, and basal inferoseptal segment are akinetic. The entire anterior wall, antero-lateral wall, entire anterior septum, and entire apex are hypokinetic. Right Ventricle: The right ventricular size is normal. No increase in right ventricular  wall thickness. Right ventricular systolic function is normal. There is normal pulmonary artery systolic pressure. The tricuspid regurgitant velocity is 1.87 m/s, and  with an assumed right atrial pressure of 3 mmHg, the estimated right ventricular systolic pressure is 17.0 mmHg. Left Atrium: Left atrial size was severely dilated. Right Atrium: Right atrial size was normal in size. Pericardium: There is  no evidence of pericardial effusion. Mitral Valve: The mitral valve is normal in structure. No evidence of mitral valve regurgitation. No evidence of mitral valve stenosis. Tricuspid Valve: The tricuspid valve is normal in structure. Tricuspid valve regurgitation is trivial. No evidence of tricuspid stenosis. Aortic Valve: The aortic valve is tricuspid. Aortic valve regurgitation is not visualized. No aortic stenosis is present. Pulmonic Valve: The pulmonic valve was normal in structure. Pulmonic valve regurgitation is not visualized. No evidence of pulmonic stenosis. Aorta: Aortic dilatation noted. There is mild dilatation of the ascending aorta, measuring 40 mm. Venous: The inferior vena cava is normal in size with greater than 50% respiratory variability, suggesting right atrial pressure of 3 mmHg. IAS/Shunts: No atrial level shunt detected by color flow Doppler.  LEFT VENTRICLE PLAX 2D LVIDd:         5.80 cm LVIDs:         5.30 cm LV PW:         1.20 cm LV IVS:        1.10 cm LVOT diam:     2.40 cm LVOT Area:     4.52 cm  RIGHT VENTRICLE RV Basal diam:  3.20 cm RV Mid diam:    2.80 cm RV S prime:     11.70 cm/s TAPSE (M-mode): 1.7 cm LEFT ATRIUM              Index        RIGHT ATRIUM           Index LA diam:        4.40 cm  1.84 cm/m   RA Area:     16.20 cm LA Vol (A2C):   105.0 ml 43.80 ml/m  RA Volume:   43.70 ml  18.23 ml/m LA Vol (A4C):   99.2 ml  41.38 ml/m LA Biplane Vol: 106.0 ml 44.22 ml/m   AORTA Ao Root diam: 3.60 cm Ao Asc diam:  3.95 cm TRICUSPID VALVE TR Peak grad:   14.0 mmHg TR Vmax:         187.00 cm/s  SHUNTS Systemic Diam: 2.40 cm Annabella Scarce MD Electronically signed by Annabella Scarce MD Signature Date/Time: 04/18/2024/2:58:31 PM    Final     Cardiac Studies   Echo   Patient Profile     63 y.o. male with new onset dilated cardiomyopathy.   Assessment & Plan    Acute heart failure with reduced ejection fraction  Substance abuse  Diabetes mellitus  Obstructive sleep apnea   Clinically in heart failure. Cr worsening but clinically still appears to have some volume and urine output minimal. Concern for low output heart failure will get our advanced heart failure team to see him today.  Will postpone his cardiac catheterization.   Continue current GDMT, on losartan for plans to transition to Entresto, Jardiance.   OSA - uses cpap  DM - on Jardiance, SSI per primary team    For questions or updates, please contact [facility] Please consult [email or web address] for contact info under Cardiology/STEMI.      Signed, Mechele Kittleson, DO  04/20/2024, 9:32 AM

## 2024-04-20 NOTE — Progress Notes (Signed)
 Mobility Specialist Progress Note:    04/20/24 1112  Mobility  Activity Ambulated with assistance  Level of Assistance Standby assist, set-up cues, supervision of patient - no hands on  Assistive Device None  Distance Ambulated (ft) 300 ft  Activity Response Tolerated well  Mobility Referral Yes  Mobility visit 1 Mobility  Mobility Specialist Start Time (ACUTE ONLY) 1112  Mobility Specialist Stop Time (ACUTE ONLY) 1153  Mobility Specialist Time Calculation (min) (ACUTE ONLY) 41 min   Received pt sitting EOB agreeable to session. No c/o any symptoms. Pt moving and ambulating well. Returned pt to room w/ wife present and all needs met.   Venetia Keel Mobility Specialist Please Neurosurgeon or Rehab Office at [phone]

## 2024-04-21 DIAGNOSIS — I5021 Acute systolic (congestive) heart failure: Secondary | ICD-10-CM | POA: Diagnosis not present

## 2024-04-21 LAB — CBC WITH DIFFERENTIAL/PLATELET
Abs Immature Granulocytes: 0.02 K/uL (ref 0.00–0.07)
Basophils Absolute: 0 K/uL (ref 0.0–0.1)
Basophils Relative: 0 %
Eosinophils Absolute: 0.2 K/uL (ref 0.0–0.5)
Eosinophils Relative: 2 %
HCT: 48.7 % (ref 39.0–52.0)
Hemoglobin: 15.7 g/dL (ref 13.0–17.0)
Immature Granulocytes: 0 %
Lymphocytes Relative: 33 %
Lymphs Abs: 3 K/uL (ref 0.7–4.0)
MCH: 29.3 pg (ref 26.0–34.0)
MCHC: 32.2 g/dL (ref 30.0–36.0)
MCV: 91 fL (ref 80.0–100.0)
Monocytes Absolute: 0.6 K/uL (ref 0.1–1.0)
Monocytes Relative: 7 %
Neutro Abs: 5.1 K/uL (ref 1.7–7.7)
Neutrophils Relative %: 58 %
Platelets: 239 K/uL (ref 150–400)
RBC: 5.35 MIL/uL (ref 4.22–5.81)
RDW: 12 % (ref 11.5–15.5)
WBC: 9 K/uL (ref 4.0–10.5)
nRBC: 0 % (ref 0.0–0.2)

## 2024-04-21 LAB — BASIC METABOLIC PANEL WITH GFR
Anion gap: 13 (ref 5–15)
BUN: 27 mg/dL — ABNORMAL HIGH (ref 8–23)
CO2: 32 mmol/L (ref 22–32)
Calcium: 9.2 mg/dL (ref 8.9–10.3)
Chloride: 95 mmol/L — ABNORMAL LOW (ref 98–111)
Creatinine, Ser: 1.6 mg/dL — ABNORMAL HIGH (ref 0.61–1.24)
GFR, Estimated: 48 mL/min — ABNORMAL LOW (ref >60)
Glucose, Bld: 184 mg/dL — ABNORMAL HIGH (ref 70–99)
Potassium: 4.2 mmol/L (ref 3.5–5.1)
Sodium: 140 mmol/L (ref 135–145)

## 2024-04-21 LAB — GLUCOSE, CAPILLARY
Glucose-Capillary: 164 mg/dL — ABNORMAL HIGH (ref 70–99)
Glucose-Capillary: 265 mg/dL — ABNORMAL HIGH (ref 70–99)
Glucose-Capillary: 180 mg/dL — ABNORMAL HIGH (ref 70–99)
Glucose-Capillary: 199 mg/dL — ABNORMAL HIGH (ref 70–99)

## 2024-04-21 LAB — LIPID PANEL
Cholesterol: 259 mg/dL — ABNORMAL HIGH (ref 0–200)
HDL: 46 mg/dL (ref >40)
LDL Cholesterol: 175 mg/dL — ABNORMAL HIGH (ref 0–99)
Total CHOL/HDL Ratio: 5.6 ratio
Triglycerides: 192 mg/dL — ABNORMAL HIGH (ref <150)
VLDL: 38 mg/dL (ref 0–40)

## 2024-04-21 MED ORDER — INSULIN ASPART 100 UNIT/ML IJ SOLN
INTRAMUSCULAR | Status: AC
  Filled 2024-04-21: qty 3

## 2024-04-21 NOTE — Progress Notes (Signed)
 Rounding Note   Patient Name: Charles Nunez Date of Encounter: 04/21/2024  [facility] Cardiologist: New to CHF   Subjective Breathing is OK  No CP   Scheduled Meds:  aspirin EC  81 mg Oral Daily   empagliflozin  10 mg Oral Daily   enoxaparin (LOVENOX) injection  60 mg Subcutaneous Daily   furosemide  40 mg Intravenous Q12H   guaiFENesin  600 mg Oral BID   insulin  aspart  0-15 Units Subcutaneous TID WC   losartan  50 mg Oral Daily   nicotine  14 mg Transdermal Daily   pregabalin  50 mg Oral TID   rosuvastatin  20 mg Oral Daily   sodium chloride flush  3 mL Intravenous Q12H   sodium chloride flush  3 mL Intravenous Q12H   Continuous Infusions:  sodium chloride     PRN Meds: sodium chloride, acetaminophen  **OR** acetaminophen , albuterol, HYDROcodone -acetaminophen , hydrOXYzine, ondansetron  **OR** ondansetron  (ZOFRAN ) IV, sodium chloride flush, sodium chloride flush   Vital Signs  Vitals:   04/21/24 0020 04/21/24 0132 04/21/24 0425 04/21/24 0817  BP: (!) 134/91  113/77 117/85  Pulse: 81  98 91  Resp: 17  20 20   Temp: 97.9 F (36.6 C)  97.7 F (36.5 C) 97.7 F (36.5 C)  TempSrc: Oral  Oral Oral  SpO2: (!) 89%  90% 94%  Weight:  125.2 kg    Height:        Intake/Output Summary (Last 24 hours) at 04/21/2024 1045 Last data filed at 04/21/2024 0817 Gross per 24 hour  Intake 758.62 ml  Output --  Net 758.62 ml      04/21/2024    1:32 AM 04/20/2024   11:00 PM 04/20/2024    1:19 PM  Last 3 Weights  Weight (lbs) 276 lb 276 lb 277 lb 9 oz  Weight (kg) 125.193 kg 125.193 kg 125.9 kg      Telemetry SR  - Personally Reviewed  ECG  No new  - Personally Reviewed  Physical Exam  GEN: Morbidly obese 63 yo in NAD Neck: Neck is full  Cardiac: RRR, no murmurs Respiratory: Clear to auscultation bilaterally. GI: Soft, nontender, obese  Ext  Tr LE edema  Labs High Sensitivity Troponin:   Recent Labs  Lab 04/18/24 0044 04/18/24 0336  TROPONINIHS 58* 69*      Chemistry Recent Labs  Lab 04/18/24 0044 04/18/24 0336 04/19/24 0259 04/20/24 0324 04/20/24 1336 04/20/24 1341 04/20/24 1343 04/21/24 0246  NA 136 137 137 136   < > 142  137 145 140  K 4.2 4.0 4.4 4.1   < > 3.3*  3.9 2.9* 4.2  CL 93* 94* 94* 93*  --   --   --  95*  CO2 30 32 33* 32  --   --   --  32  GLUCOSE 205* 159* 185* 169*  --   --   --  184*  BUN 12 11 16 20   --   --   --  27*  CREATININE 1.15 1.04 1.45* 1.48*  --   --   --  1.60*  CALCIUM 9.7 9.5 9.7 9.1  --   --   --  9.2  MG 1.7 1.8  --   --   --   --   --   --   PROT 6.5 6.5  --   --   --   --   --   --   ALBUMIN 3.3* 3.2*  --   --   --   --   --   --  AST 27 23  --   --   --   --   --   --   ALT 23 22  --   --   --   --   --   --   ALKPHOS 95 90  --   --   --   --   --   --   BILITOT 0.7 1.0  --   --   --   --   --   --   GFRNONAA >60 >60 54* 53*  --   --   --  48*  ANIONGAP 13 11 10 11   --   --   --  13   < > = values in this interval not displayed.    Lipids  Recent Labs  Lab 04/21/24 0246  CHOL 259*  TRIG 192*  HDL 46  LDLCALC 175*  CHOLHDL 5.6    Hematology Recent Labs  Lab 04/19/24 0259 04/20/24 0324 04/20/24 1336 04/20/24 1341 04/20/24 1343 04/21/24 0246  WBC 9.9 9.6  --   --   --  9.0  RBC 5.47 5.15  --   --   --  5.35  HGB 16.0 15.2   < > 15.0  16.3 13.6 15.7  HCT 49.9 47.0   < > 44.0  48.0 40.0 48.7  MCV 91.2 91.3  --   --   --  91.0  MCH 29.3 29.5  --   --   --  29.3  MCHC 32.1 32.3  --   --   --  32.2  RDW 12.0 12.2  --   --   --  12.0  PLT 245 222  --   --   --  239   < > = values in this interval not displayed.   Thyroid   Recent Labs  Lab 04/18/24 0044  TSH 1.260    BNP Recent Labs  Lab 04/17/24 0712  PROBNP 613.0*    DDimer  Recent Labs  Lab 04/18/24 0336  DDIMER 0.63*     Radiology  CARDIAC CATHETERIZATION Result Date: 04/20/2024   The left ventricular ejection fraction is less than 25% by visual estimate. Findings: Ao = 101/87 (94) LV = 103/17 RA =   7 RV = 39/10 PA = 40/28 (32) PCW = 17 Fick cardiac output/index = 4.5/1.9 Thermo CO/CI = 5.7/2.4 PVR = 3.0 WU Ao sat = 87% PA sat = 52% PAPi = 1.6 Assessment: 1. Left-dominant coronary system with no CAD 2. 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 Well compensated filling pressures with moderate to severely depressed co due to biventricular failure 4. Likely OHS Plan/Discussion: He has severe NICM. With low output. Titrate GDMT. Add digoxin. Check cMRI. Toribio Fuel, MD 2:14 PM   Cardiac Studies R/L Heart Cath    Ao = 101/87 (94) LV = 103/17 RA =  7 RV = 39/10 PA = 40/28 (32) PCW = 17 Fick cardiac output/index = 4.5/1.9 Thermo CO/CI = 5.7/2.4 PVR = 3.0 WU Ao sat = 87% PA sat = 52% PAPi = 1.6   Assessment: 1. Left-dominant coronary system with no CAD 2. 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 Well compensated filling pressures with moderate to severely depressed co due to biventricular failure 4. Likely OHS   Plan/Discussion:   He has severe NICM. With low output. Titrate GDMT. Add digoxin. Check cMRI.   Patient Profile   63 y.o. male   Assessment & Plan   1  HFrEF  LHC  yesteday showed no CAD  R heart cath--  Filling pressures OK Biventricular dysfunction  MRI ordered    Pt needs under full anesthesia  Will look to see if can be done while here   2  Renal  Cr 1.6 today  Was 1.15 on admit     HOld diuretics for now    Check in AM    3  OSA  On CPAP    4  Hx of noncompliance    Counselled on importance of daily weights, watching Na, fluids      Signed, Vina Gull, MD  04/21/2024, 10:45 AM

## 2024-04-21 NOTE — Progress Notes (Signed)
 PROGRESS NOTE    Charles Nunez  FMW:[id number] DOB: 1960/08/22 DOA: 04/17/2024 PCP: [facility]   Brief Narrative:   Charles Nunez is a 63 y.o. male with PMH of  Dm2, HTN OSA, active tobacco abuse who presented with increased shortness of breath, cough,wheezing and increased work of breathing since 04/16/24. CT angio chest negative for PE or pneumonia, findings of acute bronchitis. Echocardiogram shows LVEF 20 to 25%.  Patient receiving diuresis with IV Lasix.  Left and right heart catheterization 11/7: No CAD.  Assessment & Plan:   Principal Problem:   Acute clinical systolic heart failure (HCC) Active Problems:   Essential hypertension   Hypercholesterolemia   OSA (obstructive sleep apnea)   Uncontrolled type 2 diabetes mellitus with hyperglycemia, with long-term current use of insulin  (HCC)   Morbid obesity with BMI of 40.0-44.9, adult (HCC)   Tachycardia    Acute on chronic diastolic CHF Nonischemic cardiomyopathy Presented w/ shortness of breath cough and abnormal BNP 658m, trop up-flat chest x-ray/CTA no PE, question bronchitis Echocardiogram shows LVEF 20 to 25%, regional wall motion abnormalities, normal RV function. Left and right heart cath 11/7: No CAD, severe NICM EF 20 to 25%, well compensated filling pressure with moderate to severely depressed CO due to biventricular failure  GDMT: Continue losartan 50 mg daily, Jardiance 10 mg daily. Slow rise in creatinine to 1.5.  Will monitor intake and output, daily labs. Cardiac MRI pending  Hypercholesterolemia: Not on meds.   HTN: Borderline, resume on lisinopril   DM 2: PTA on  PO meds only.  Continue SSI for now, CBG/ A1c as below.  OSA: noncompliant with CPAP    Class II w/ Body mass index is 39.75 kg/m.: Will benefit with PCP follow-up, weight loss,healthy lifestyle and compliance  w/ CPAP   DVT prophylaxis: SCDs Start: 04/17/24 2344 add Lovenox Code Status:   Code Status: Full Code Family  Communication: plan of care discussed with patient at bedside. Patient status is: Remains hospitalized because of severity of illness Level of care: Telemetry    Dispo: The patient is from: HOME            Anticipated disposition: Home   subjective:  Patient seen and examined at the bedside.  Denies chest pain or shortness of breath.  He says he is claustrophobic and needs to get MRI under sedation  Objective: Vitals:   04/21/24 0132 04/21/24 0425 04/21/24 0817 04/21/24 1137  BP:  113/77 117/85 132/80  Pulse:  98 91 (!) 101  Resp:  20 20 18   Temp:  97.7 F (36.5 C) 97.7 F (36.5 C) 97.8 F (36.6 C)  TempSrc:  Oral Oral Oral  SpO2:  90% 94% 96%  Weight: 125.2 kg     Height:        Intake/Output Summary (Last 24 hours) at 04/21/2024 1436 Last data filed at 04/21/2024 0817 Gross per 24 hour  Intake 758.62 ml  Output --  Net 758.62 ml   Filed Weights   04/20/24 1319 04/20/24 2300 04/21/24 0132  Weight: 125.9 kg 125.2 kg 125.2 kg    Examination:  General exam: Appears calm and comfortable  Respiratory system: Bilateral decreased breath sounds at bases, mild crackles bilaterally Cardiovascular system: S1 & S2 heard, Rate controlled Gastrointestinal system: Abdomen is mildly distended, soft and nontender. Normal bowel sounds heard. Extremities: Mild lower extremity edema bilaterally Central nervous system: Alert and oriented. No focal neurological deficits. Moving extremities Skin: No rashes, lesions or ulcers Psychiatry: Judgement and  insight appear normal. Mood & affect appropriate.     Data Reviewed: I have personally reviewed following labs and imaging studies  CBC: Recent Labs  Lab 04/18/24 0044 04/18/24 0336 04/19/24 0259 04/20/24 0324 04/20/24 1336 04/20/24 1341 04/20/24 1343 04/21/24 0246  WBC 10.4 9.7 9.9 9.6  --   --   --  9.0  NEUTROABS 6.0  --   --   --   --   --   --  5.1  HGB 15.7 15.5 16.0 15.2 16.3 15.0  16.3 13.6 15.7  HCT 48.1 47.7 49.9  47.0 48.0 44.0  48.0 40.0 48.7  MCV 91.4 90.3 91.2 91.3  --   --   --  91.0  PLT 243 228 245 222  --   --   --  239   Basic Metabolic Panel: Recent Labs  Lab 04/18/24 0044 04/18/24 0336 04/19/24 0259 04/20/24 0324 04/20/24 1336 04/20/24 1341 04/20/24 1343 04/21/24 0246  NA 136 137 137 136 137 142  137 145 140  K 4.2 4.0 4.4 4.1 4.0 3.3*  3.9 2.9* 4.2  CL 93* 94* 94* 93*  --   --   --  95*  CO2 30 32 33* 32  --   --   --  32  GLUCOSE 205* 159* 185* 169*  --   --   --  184*  BUN 12 11 16 20   --   --   --  27*  CREATININE 1.15 1.04 1.45* 1.48*  --   --   --  1.60*  CALCIUM 9.7 9.5 9.7 9.1  --   --   --  9.2  MG 1.7 1.8  --   --   --   --   --   --   PHOS 2.3* 2.5  --   --   --   --   --   --    GFR: Estimated Creatinine Clearance: 62.8 mL/min (A) (by C-G formula based on SCr of 1.6 mg/dL (H)). Liver Function Tests: Recent Labs  Lab 04/18/24 0044 04/18/24 0336  AST 27 23  ALT 23 22  ALKPHOS 95 90  BILITOT 0.7 1.0  PROT 6.5 6.5  ALBUMIN 3.3* 3.2*   No results for input(s): LIPASE, AMYLASE in the last 168 hours. No results for input(s): AMMONIA in the last 168 hours. Coagulation Profile: No results for input(s): INR, PROTIME in the last 168 hours. Cardiac Enzymes: No results for input(s): CKTOTAL, CKMB, CKMBINDEX, TROPONINI in the last 168 hours. BNP (last 3 results) Recent Labs    04/17/24 0712  PROBNP 613.0*   HbA1C: No results for input(s): HGBA1C in the last 72 hours.  CBG: Recent Labs  Lab 04/20/24 1059 04/20/24 1543 04/20/24 2029 04/21/24 0615 04/21/24 1134  GLUCAP 196* 233* 234* 164* 265*   Lipid Profile: Recent Labs    04/21/24 0246  CHOL 259*  HDL 46  LDLCALC 175*  TRIG 192*  CHOLHDL 5.6   Thyroid  Function Tests: No results for input(s): TSH, T4TOTAL, FREET4, T3FREE, THYROIDAB in the last 72 hours.  Anemia Panel: No results for input(s): VITAMINB12, FOLATE, FERRITIN, TIBC, IRON, RETICCTPCT  in the last 72 hours. Sepsis Labs: No results for input(s): PROCALCITON, LATICACIDVEN in the last 168 hours.  Recent Results (from the past 240 hours)  Resp panel by RT-PCR (RSV, Flu A&B, Covid) Anterior Nasal Swab     Status: None   Collection Time: 04/17/24  7:29 AM   Specimen: Anterior Nasal Swab  Result Value Ref Range Status   SARS Coronavirus 2 by RT PCR NEGATIVE NEGATIVE Final    Comment: (NOTE) SARS-CoV-2 target nucleic acids are NOT DETECTED.  The SARS-CoV-2 RNA is generally detectable in upper respiratory specimens during the acute phase of infection. The lowest concentration of SARS-CoV-2 viral copies this assay can detect is 138 copies/mL. A negative result does not preclude SARS-Cov-2 infection and should not be used as the sole basis for treatment or other patient management decisions. A negative result may occur with  improper specimen collection/handling, submission of specimen other than nasopharyngeal swab, presence of viral mutation(s) within the areas targeted by this assay, and inadequate number of viral copies(<138 copies/mL). A negative result must be combined with clinical observations, patient history, and epidemiological information. The expected result is Negative.  Fact Sheet for Patients:  bloggercourse.com  Fact Sheet for Healthcare Providers:  seriousbroker.it  This test is no t yet approved or cleared by the [place]  FDA and  has been authorized for detection and/or diagnosis of SARS-CoV-2 by FDA under an Emergency Use Authorization (EUA). This EUA will remain  in effect (meaning this test can be used) for the duration of the COVID-19 declaration under Section 564(b)(1) of the Act, 21 U.S.C.section 360bbb-3(b)(1), unless the authorization is terminated  or revoked sooner.       Influenza A by PCR NEGATIVE NEGATIVE Final   Influenza B by PCR NEGATIVE NEGATIVE Final    Comment:  (NOTE) The Xpert Xpress SARS-CoV-2/FLU/RSV plus assay is intended as an aid in the diagnosis of influenza from Nasopharyngeal swab specimens and should not be used as a sole basis for treatment. Nasal washings and aspirates are unacceptable for Xpert Xpress SARS-CoV-2/FLU/RSV testing.  Fact Sheet for Patients: bloggercourse.com  Fact Sheet for Healthcare Providers: seriousbroker.it  This test is not yet approved or cleared by the [place]  FDA and has been authorized for detection and/or diagnosis of SARS-CoV-2 by FDA under an Emergency Use Authorization (EUA). This EUA will remain in effect (meaning this test can be used) for the duration of the COVID-19 declaration under Section 564(b)(1) of the Act, 21 U.S.C. section 360bbb-3(b)(1), unless the authorization is terminated or revoked.     Resp Syncytial Virus by PCR NEGATIVE NEGATIVE Final    Comment: (NOTE) Fact Sheet for Patients: bloggercourse.com  Fact Sheet for Healthcare Providers: seriousbroker.it  This test is not yet approved or cleared by the [place]  FDA and has been authorized for detection and/or diagnosis of SARS-CoV-2 by FDA under an Emergency Use Authorization (EUA). This EUA will remain in effect (meaning this test can be used) for the duration of the COVID-19 declaration under Section 564(b)(1) of the Act, 21 U.S.C. section 360bbb-3(b)(1), unless the authorization is terminated or revoked.  Performed at [facility], [address], [place], [place] [address]          Radiology Studies: CARDIAC CATHETERIZATION Result Date: 04/20/2024   The left ventricular ejection fraction is less than 25% by visual estimate. Findings: Ao = 101/87 (94) LV = 103/17 RA =  7 RV = 39/10 PA = 40/28 (32) PCW = 17 Fick cardiac output/index = 4.5/1.9 Thermo CO/CI = 5.7/2.4 PVR = 3.0 WU Ao sat = 87%  PA sat = 52% PAPi = 1.6 Assessment: 1. Left-dominant coronary system with no CAD 2. 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 Well compensated filling pressures with moderate to severely depressed co due to biventricular failure 4. Likely OHS Plan/Discussion: He has severe NICM. With low  output. Titrate GDMT. Add digoxin. Check cMRI. Daniel Bensimhon, MD 2:14 PM       Scheduled Meds:  aspirin EC  81 mg Oral Daily   empagliflozin  10 mg Oral Daily   enoxaparin (LOVENOX) injection  60 mg Subcutaneous Daily   furosemide  40 mg Intravenous Q12H   guaiFENesin  600 mg Oral BID   insulin  aspart  0-15 Units Subcutaneous TID WC   losartan  50 mg Oral Daily   nicotine  14 mg Transdermal Daily   pregabalin  50 mg Oral TID   rosuvastatin  20 mg Oral Daily   sodium chloride flush  3 mL Intravenous Q12H   sodium chloride flush  3 mL Intravenous Q12H   Continuous Infusions:  sodium chloride            Ariahna Smiddy, MD [facility] 04/21/2024, 2:36 PM

## 2024-04-21 NOTE — Plan of Care (Signed)

## 2024-04-22 ENCOUNTER — Encounter (HOSPITAL_COMMUNITY): Payer: Self-pay | Admitting: Internal Medicine

## 2024-04-22 DIAGNOSIS — I5021 Acute systolic (congestive) heart failure: Secondary | ICD-10-CM | POA: Diagnosis not present

## 2024-04-22 LAB — CBC
HCT: 47.3 % (ref 39.0–52.0)
Hemoglobin: 15.3 g/dL (ref 13.0–17.0)
MCH: 29.3 pg (ref 26.0–34.0)
MCHC: 32.3 g/dL (ref 30.0–36.0)
MCV: 90.4 fL (ref 80.0–100.0)
Platelets: 251 K/uL (ref 150–400)
RBC: 5.23 MIL/uL (ref 4.22–5.81)
RDW: 12.1 % (ref 11.5–15.5)
WBC: 9.5 K/uL (ref 4.0–10.5)
nRBC: 0 % (ref 0.0–0.2)

## 2024-04-22 LAB — BASIC METABOLIC PANEL WITH GFR
Anion gap: 12 (ref 5–15)
BUN: 29 mg/dL — ABNORMAL HIGH (ref 8–23)
CO2: 33 mmol/L — ABNORMAL HIGH (ref 22–32)
Calcium: 9.2 mg/dL (ref 8.9–10.3)
Chloride: 93 mmol/L — ABNORMAL LOW (ref 98–111)
Creatinine, Ser: 1.57 mg/dL — ABNORMAL HIGH (ref 0.61–1.24)
GFR, Estimated: 49 mL/min — ABNORMAL LOW (ref >60)
Glucose, Bld: 210 mg/dL — ABNORMAL HIGH (ref 70–99)
Potassium: 4.7 mmol/L (ref 3.5–5.1)
Sodium: 138 mmol/L (ref 135–145)

## 2024-04-22 LAB — GLUCOSE, CAPILLARY
Glucose-Capillary: 182 mg/dL — ABNORMAL HIGH (ref 70–99)
Glucose-Capillary: 151 mg/dL — ABNORMAL HIGH (ref 70–99)
Glucose-Capillary: 220 mg/dL — ABNORMAL HIGH (ref 70–99)
Glucose-Capillary: 252 mg/dL — ABNORMAL HIGH (ref 70–99)

## 2024-04-22 LAB — LIPOPROTEIN A (LPA): Lipoprotein (a): 136.1 nmol/L — ABNORMAL HIGH (ref <75.0)

## 2024-04-22 MED ORDER — IOHEXOL 350 MG/ML SOLN
INTRAVENOUS | Status: DC | PRN
  Administered 2024-04-20: 80 mL

## 2024-04-22 MED ORDER — FUROSEMIDE 40 MG PO TABS: 40.0000 mg | ORAL_TABLET | Freq: Every day | ORAL | Status: DC

## 2024-04-22 NOTE — Progress Notes (Signed)
 Rounding Note   Patient Name: Charles Nunez Date of Encounter: 04/22/2024  [facility] Cardiologist: New to CHF   Subjective Breathing is OK  No CP  Feels good   Scheduled Meds:  aspirin EC  81 mg Oral Daily   empagliflozin  10 mg Oral Daily   enoxaparin (LOVENOX) injection  60 mg Subcutaneous Daily   furosemide  40 mg Intravenous Q12H   guaiFENesin  600 mg Oral BID   insulin  aspart  0-15 Units Subcutaneous TID WC   losartan  50 mg Oral Daily   nicotine  14 mg Transdermal Daily   pregabalin  50 mg Oral TID   rosuvastatin  20 mg Oral Daily   sodium chloride flush  3 mL Intravenous Q12H   sodium chloride flush  3 mL Intravenous Q12H   Continuous Infusions:   PRN Meds: acetaminophen  **OR** acetaminophen , albuterol, HYDROcodone -acetaminophen , hydrOXYzine, ondansetron  **OR** ondansetron  (ZOFRAN ) IV, sodium chloride flush, sodium chloride flush   Vital Signs  Vitals:   04/21/24 1625 04/21/24 2015 04/22/24 0020 04/22/24 0345  BP: (!) 116/99 122/83 (!) 121/93 (!) 119/95  Pulse: (!) 106     Resp: 18 17 19 18   Temp: 98.2 F (36.8 C) 98.2 F (36.8 C) 98 F (36.7 C) 98.2 F (36.8 C)  TempSrc: Oral Oral  Oral  SpO2: 99% 99% 99% 99%  Weight:      Height:        Intake/Output Summary (Last 24 hours) at 04/22/2024 0804 Last data filed at 04/21/2024 9182 Gross per 24 hour  Intake 240 ml  Output --  Net 240 ml      04/21/2024    1:32 AM 04/20/2024   11:00 PM 04/20/2024    1:19 PM  Last 3 Weights  Weight (lbs) 276 lb 276 lb 277 lb 9 oz  Weight (kg) 125.193 kg 125.193 kg 125.9 kg      Telemetry SR  - Personally Reviewed  ECG  No new  - Personally Reviewed  Physical Exam  GEN: Morbidly obese 63 yo in NAD Neck: Neck is full  Cardiac: RRR, no murmurs Respiratory: Clear to auscultation bilaterally. GI: Soft, nontender, obese  Ext  Tr LE edema  Labs High Sensitivity Troponin:   Recent Labs  Lab 04/18/24 0044 04/18/24 0336  TROPONINIHS 58* 69*      Chemistry Recent Labs  Lab 04/18/24 0044 04/18/24 0336 04/19/24 0259 04/20/24 0324 04/20/24 1336 04/20/24 1343 04/21/24 0246 04/22/24 0233  NA 136 137   < > 136   < > 145 140 138  K 4.2 4.0   < > 4.1   < > 2.9* 4.2 4.7  CL 93* 94*   < > 93*  --   --  95* 93*  CO2 30 32   < > 32  --   --  32 33*  GLUCOSE 205* 159*   < > 169*  --   --  184* 210*  BUN 12 11   < > 20  --   --  27* 29*  CREATININE 1.15 1.04   < > 1.48*  --   --  1.60* 1.57*  CALCIUM 9.7 9.5   < > 9.1  --   --  9.2 9.2  MG 1.7 1.8  --   --   --   --   --   --   PROT 6.5 6.5  --   --   --   --   --   --  ALBUMIN 3.3* 3.2*  --   --   --   --   --   --   AST 27 23  --   --   --   --   --   --   ALT 23 22  --   --   --   --   --   --   ALKPHOS 95 90  --   --   --   --   --   --   BILITOT 0.7 1.0  --   --   --   --   --   --   GFRNONAA >60 >60   < > 53*  --   --  48* 49*  ANIONGAP 13 11   < > 11  --   --  13 12   < > = values in this interval not displayed.    Lipids  Recent Labs  Lab 04/21/24 0246  CHOL 259*  TRIG 192*  HDL 46  LDLCALC 175*  CHOLHDL 5.6    Hematology Recent Labs  Lab 04/20/24 0324 04/20/24 1336 04/20/24 1343 04/21/24 0246 04/22/24 0233  WBC 9.6  --   --  9.0 9.5  RBC 5.15  --   --  5.35 5.23  HGB 15.2   < > 13.6 15.7 15.3  HCT 47.0   < > 40.0 48.7 47.3  MCV 91.3  --   --  91.0 90.4  MCH 29.5  --   --  29.3 29.3  MCHC 32.3  --   --  32.2 32.3  RDW 12.2  --   --  12.0 12.1  PLT 222  --   --  239 251   < > = values in this interval not displayed.   Thyroid   Recent Labs  Lab 04/18/24 0044  TSH 1.260    BNP Recent Labs  Lab 04/17/24 0712  PROBNP 613.0*    DDimer  Recent Labs  Lab 04/18/24 0336  DDIMER 0.63*     Radiology  CARDIAC CATHETERIZATION Result Date: 04/20/2024   The left ventricular ejection fraction is less than 25% by visual estimate. Findings: Ao = 101/87 (94) LV = 103/17 RA =  7 RV = 39/10 PA = 40/28 (32) PCW = 17 Fick cardiac output/index = 4.5/1.9  Thermo CO/CI = 5.7/2.4 PVR = 3.0 WU Ao sat = 87% PA sat = 52% PAPi = 1.6 Assessment: 1. Left-dominant coronary system with no CAD 2. 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 Well compensated filling pressures with moderate to severely depressed co due to biventricular failure 4. Likely OHS Plan/Discussion: He has severe NICM. With low output. Titrate GDMT. Add digoxin. Check cMRI. Toribio Fuel, MD 2:14 PM   Cardiac Studies R/L Heart Cath    Ao = 101/87 (94) LV = 103/17 RA =  7 RV = 39/10 PA = 40/28 (32) PCW = 17 Fick cardiac output/index = 4.5/1.9 Thermo CO/CI = 5.7/2.4 PVR = 3.0 WU Ao sat = 87% PA sat = 52% PAPi = 1.6   Assessment: 1. Left-dominant coronary system with no CAD 2. 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 Well compensated filling pressures with moderate to severely depressed co due to biventricular failure 4. Likely OHS   Plan/Discussion:   He has severe NICM. With low output. Titrate GDMT. Add digoxin. Check cMRI.   Patient Profile   63 y.o. male   Assessment & Plan   1  HFrEF  LHC 11/7  showed no CAD  R heart cath--  Filling pressures  PCWP 17 Biventricular dysfunction  MRI ordered    Pt needs under full anesthesia  Will look to see if can be done while here  He has continued on IV lasix   Feels good  Close to d/c   Will transition to PO tomorrow   2  Renal  Cr 1.57 today  Was 1.15 on admit       3  OSA  On CPAP    4  Hx of noncompliance    PT aware of importance of daily weights, watching Na, fluids      Signed, Vina Gull, MD  04/22/2024, 8:04 AM

## 2024-04-22 NOTE — Progress Notes (Signed)
 [facility] Progress Note Patient: Charles Nunez FMW:[id number] DOB: 1960-12-26  DOA: 04/17/2024 DOS: the patient was seen and examined on 04/22/2024  Brief Hospital Course: Charles Nunez is a 63 y.o. male with PMH of  Dm2, HTN OSA, active tobacco abuse who presented with increased shortness of breath, cough,wheezing and increased work of breathing since 04/16/24. CT angio chest negative for PE or pneumonia, findings of acute bronchitis. Echocardiogram shows LVEF 20 to 25%.  Patient receiving diuresis with IV Lasix.  Underwent right and left heart catheterization per cardiology.  No CAD.  Assessment and Plan: Acute on chronic diastolic CHF Nonischemic cardiomyopathy Presented w/ shortness of breath cough and abnormal BNP 659m, trop up-flat chest x-ray/CTA no PE, question bronchitis Echocardiogram shows LVEF 20 to 25%, regional wall motion abnormalities, normal RV function. Left and right heart cath 11/7: No CAD, severe NICM EF 20 to 25%, well compensated filling pressure with moderate to severely depressed CO due to biventricular failure  GDMT: Continue losartan 50 mg daily, Jardiance 10 mg daily. Slow rise in creatinine to 1.5.  Will monitor intake and output, daily labs. Cardiac MRI pending I modified the order for anesthesia.   Hypercholesterolemia: Not on meds.   HTN: Borderline, resume on lisinopril   DM 2: PTA on  PO meds only.  Continue SSI for now,    OSA: noncompliant with CPAP    Obesity Class 2 Body mass index is 39.26 kg/m.  Placing the pt at higher risk of poor outcomes.  Subjective: Ongoing shortness of breath.  No nausea or vomiting or fever no chills.  No chest pain.  Physical Exam: Basal crackles. S1-S2 present No edema. Bowel sound present  Data Reviewed: I have Reviewed nursing notes, Vitals, and Lab results. Since last encounter, pertinent lab results CBC BMP   . I have ordered test including CBC and BMP  . I have discussed pt's care plan and test  results with cardiology  .   Disposition: Status is: Inpatient Remains inpatient appropriate because: Monitor for further workup cardiology  SCDs Start: 04/17/24 2344   Family Communication: No one at bedside Level of care: Telemetry   Vitals:   04/22/24 0345 04/22/24 0833 04/22/24 1155 04/22/24 1634  BP: (!) 119/95 118/88 (!) 130/90 118/75  Pulse:   98 93  Resp: 18 20 18 17   Temp: 98.2 F (36.8 C) 97.8 F (36.6 C) 97.7 F (36.5 C) 97.7 F (36.5 C)  TempSrc: Oral Oral Oral Oral  SpO2: 99% 98% 98% 94%  Weight:  124.1 kg    Height:  5' 10 (1.778 m)       Author: Yetta Blanch, MD 04/22/2024 4:49 PM  Please look on [email or web address] to find out who is on call.

## 2024-04-22 NOTE — Progress Notes (Signed)
 Mobility Specialist Progress Note:    04/22/24 1231  Mobility  Activity Ambulated independently  Level of Assistance Independent after set-up  Assistive Device None  Distance Ambulated (ft) 300 ft  Range of Motion/Exercises Active  Activity Response Tolerated well  Mobility Referral Yes  Mobility visit 1 Mobility  Mobility Specialist Start Time (ACUTE ONLY) 1231  Mobility Specialist Stop Time (ACUTE ONLY) 1240  Mobility Specialist Time Calculation (min) (ACUTE ONLY) 9 min   Received pt sitting EOB agreeable to session. No c/o any symptoms. Pt moving and ambulating well w/ no issues and no assist. Returned pt to room w/ all needs met.  Venetia Keel Mobility Specialist Please Neurosurgeon or Rehab Office at [phone]

## 2024-04-23 ENCOUNTER — Inpatient Hospital Stay (HOSPITAL_COMMUNITY)

## 2024-04-23 ENCOUNTER — Inpatient Hospital Stay (HOSPITAL_COMMUNITY): Admitting: Certified Registered Nurse Anesthetist

## 2024-04-23 ENCOUNTER — Encounter (HOSPITAL_COMMUNITY): Payer: Self-pay | Admitting: Family Medicine

## 2024-04-23 ENCOUNTER — Encounter (HOSPITAL_COMMUNITY): Admission: EM | Disposition: A | Payer: Self-pay | Source: Home / Self Care | Attending: Hospitalist

## 2024-04-23 ENCOUNTER — Encounter (HOSPITAL_COMMUNITY)
Admission: EM | Disposition: A | Discharge status: Home / Self Care | Payer: Self-pay | Source: Home / Self Care | Attending: Internal Medicine

## 2024-04-23 ENCOUNTER — Other Ambulatory Visit: Payer: Self-pay

## 2024-04-23 DIAGNOSIS — I5021 Acute systolic (congestive) heart failure: Secondary | ICD-10-CM

## 2024-04-23 DIAGNOSIS — I110 Hypertensive heart disease with heart failure: Secondary | ICD-10-CM | POA: Diagnosis not present

## 2024-04-23 DIAGNOSIS — F17210 Nicotine dependence, cigarettes, uncomplicated: Secondary | ICD-10-CM

## 2024-04-23 LAB — BASIC METABOLIC PANEL WITH GFR
Anion gap: 9 (ref 5–15)
BUN: 31 mg/dL — ABNORMAL HIGH (ref 8–23)
CO2: 33 mmol/L — ABNORMAL HIGH (ref 22–32)
Calcium: 9.3 mg/dL (ref 8.9–10.3)
Chloride: 95 mmol/L — ABNORMAL LOW (ref 98–111)
Creatinine, Ser: 1.6 mg/dL — ABNORMAL HIGH (ref 0.61–1.24)
GFR, Estimated: 48 mL/min — ABNORMAL LOW (ref >60)
Glucose, Bld: 173 mg/dL — ABNORMAL HIGH (ref 70–99)
Potassium: 4.7 mmol/L (ref 3.5–5.1)
Sodium: 137 mmol/L (ref 135–145)

## 2024-04-23 LAB — CBC
HCT: 49.4 % (ref 39.0–52.0)
Hemoglobin: 15.9 g/dL (ref 13.0–17.0)
MCH: 29.2 pg (ref 26.0–34.0)
MCHC: 32.2 g/dL (ref 30.0–36.0)
MCV: 90.6 fL (ref 80.0–100.0)
Platelets: 267 K/uL (ref 150–400)
RBC: 5.45 MIL/uL (ref 4.22–5.81)
RDW: 12.2 % (ref 11.5–15.5)
WBC: 9.7 K/uL (ref 4.0–10.5)
nRBC: 0 % (ref 0.0–0.2)

## 2024-04-23 LAB — GLUCOSE, CAPILLARY
Glucose-Capillary: 129 mg/dL — ABNORMAL HIGH (ref 70–99)
Glucose-Capillary: 133 mg/dL — ABNORMAL HIGH (ref 70–99)
Glucose-Capillary: 131 mg/dL — ABNORMAL HIGH (ref 70–99)
Glucose-Capillary: 125 mg/dL — ABNORMAL HIGH (ref 70–99)
Glucose-Capillary: 212 mg/dL — ABNORMAL HIGH (ref 70–99)
Glucose-Capillary: 139 mg/dL — ABNORMAL HIGH (ref 70–99)

## 2024-04-23 SURGERY — MRI WITH ANESTHESIA
Anesthesia: Monitor Anesthesia Care

## 2024-04-23 SURGERY — RIGHT/LEFT HEART CATH AND CORONARY ANGIOGRAPHY
Anesthesia: LOCAL

## 2024-04-23 MED ORDER — CHLORHEXIDINE GLUCONATE 0.12 % MT SOLN
OROMUCOSAL | Status: AC
  Administered 2024-04-23: 15 mL via OROMUCOSAL
  Filled 2024-04-23: qty 15

## 2024-04-23 MED ORDER — DEXMEDETOMIDINE HCL IN NACL 80 MCG/20ML IV SOLN
INTRAVENOUS | Status: DC | PRN
  Administered 2024-04-23 (×3): 8 ug via INTRAVENOUS

## 2024-04-23 MED ORDER — FENTANYL CITRATE (PF) 100 MCG/2ML IJ SOLN
INTRAMUSCULAR | Status: DC | PRN
  Administered 2024-04-23: 25 ug via INTRAVENOUS

## 2024-04-23 MED ORDER — LACTATED RINGERS IV SOLN: INTRAVENOUS | Status: DC

## 2024-04-23 MED ORDER — DEXMEDETOMIDINE HCL IN NACL 400 MCG/100ML IV SOLN
INTRAVENOUS | Status: DC | PRN
  Administered 2024-04-23: .5 ug/kg/h via INTRAVENOUS

## 2024-04-23 MED ORDER — ORAL CARE MOUTH RINSE: 15.0000 mL | Freq: Once | OROMUCOSAL | Status: AC

## 2024-04-23 MED ORDER — MIDAZOLAM HCL (PF) 2 MG/2ML IJ SOLN
INTRAMUSCULAR | Status: DC | PRN
  Administered 2024-04-23: 2 mg via INTRAVENOUS

## 2024-04-23 MED ORDER — SACUBITRIL-VALSARTAN 24-26 MG PO TABS
1.0000 | ORAL_TABLET | Freq: Two times a day (BID) | ORAL | Status: DC
  Administered 2024-04-23 – 2024-04-24 (×2): 1 via ORAL
  Filled 2024-04-23 (×2): qty 1

## 2024-04-23 MED ORDER — ATORVASTATIN CALCIUM 80 MG PO TABS
80.0000 mg | ORAL_TABLET | Freq: Every day | ORAL | Status: DC
  Administered 2024-04-23 – 2024-04-24 (×2): 80 mg via ORAL
  Filled 2024-04-23 (×2): qty 1

## 2024-04-23 MED ORDER — DEXMEDETOMIDINE HCL IN NACL 400 MCG/100ML IV SOLN
INTRAVENOUS | Status: AC
  Filled 2024-04-23: qty 100

## 2024-04-23 MED ORDER — CHLORHEXIDINE GLUCONATE 0.12 % MT SOLN: 15.0000 mL | Freq: Once | OROMUCOSAL | Status: AC

## 2024-04-23 NOTE — TOC Initial Note (Signed)
 Transition of Care [facility]) - Initial/Assessment Note    Patient Details  Name: Charles Nunez MRN: [id number] Date of Birth: 05/22/1961  Transition of Care [facility]) CM/SW Contact:    Arlana JINNY Nicholaus ISRA[place] Phone Number: [phone] 04/23/2024, 2:45 PM  Clinical Narrative:   HF CSW met with patient and wife at bedside. Patient  and wife live together along with their son. Patient stated that he has no history of HH services. Patient stated that he does not use any equipment. Patient stated that he has a scale at home. Patient stated that he has a PCP. CSW explained that a hospital follow up appointment is typically scheduled closer towards dc. Patient agrees. Wife will provide transportation home at dc.   HF CSW/CM will continue to follow and monitor for dc readiness.                       Patient Goals and CMS Choice            Expected Discharge Plan and Services                                              Prior Living Arrangements/Services                       Activities of Daily Living   ADL Screening (condition at time of admission) Independently performs ADLs?: Yes (appropriate for developmental age) Is the patient deaf or have difficulty hearing?: No Does the patient have difficulty seeing, even when wearing glasses/contacts?: No Does the patient have difficulty concentrating, remembering, or making decisions?: No  Permission Sought/Granted                  Emotional Assessment              Admission diagnosis:  Acute on chronic diastolic CHF (congestive heart failure) (HCC) [I50.33] Patient Active Problem List   Diagnosis Date Noted   Tachycardia 04/18/2024   Acute clinical systolic heart failure (HCC) 04/17/2024   Nail, injury by, initial encounter 07/03/2021   Hypercholesterolemia 04/03/2018   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/03/2018   Lumbar radiculopathy 04/29/2017   Closed fracture of third lumbar vertebra (HCC) 04/01/2017    Morbid obesity with BMI of 40.0-44.9, adult (HCC) 11/01/2016   OSA (obstructive sleep apnea) 10/29/2016   Essential hypertension 06/24/2016   Opioid dependence in remission (HCC) 06/24/2016   Uncontrolled type 2 diabetes mellitus with hyperglycemia, with long-term current use of insulin  (HCC) 06/24/2016   Post-traumatic osteoarthritis of left knee 03/14/2014   PCP:  [facility] [address] Pharmacy:   [id number] - [place], [place] - [address] AT [facility] [address] [place] [address] Phone: [phone] Fax: [phone]  [facility] [address] [place] [address] Phone: [phone] Fax: [phone]     Social Drivers of Health (SDOH) Social History: SDOH Screenings   Food Insecurity: No Food Insecurity (04/17/2024)  Housing: Low Risk  (04/17/2024)  Transportation Needs: No Transportation Needs (04/17/2024)  Utilities: Not At Risk (04/17/2024)  Tobacco Use: High Risk (04/23/2024)   SDOH Interventions:     Readmission Risk Interventions     No data to display

## 2024-04-23 NOTE — Anesthesia Procedure Notes (Signed)
 Procedure Name: MAC Date/Time: 04/23/2024 12:00 PM  Performed by: Claudene Arlin LABOR, CRNAPre-anesthesia Checklist: Patient identified, Emergency Drugs available, Suction available and Patient being monitored Patient Re-evaluated:Patient Re-evaluated prior to induction Oxygen Delivery Method: Nasal cannula Induction Type: IV induction Placement Confirmation: positive ETCO2 Dental Injury: Teeth and Oropharynx as per pre-operative assessment

## 2024-04-23 NOTE — Anesthesia Preprocedure Evaluation (Addendum)
 Anesthesia Evaluation  Patient identified by MRN, date of birth, ID band Patient awake    Reviewed: Allergy & Precautions, NPO status , Patient's Chart, lab work & pertinent test results  Airway Mallampati: III  TM Distance: >3 FB Neck ROM: Full    Dental  (+) Missing   Pulmonary sleep apnea , Current Smoker and Patient abstained from smoking.   Pulmonary exam normal        Cardiovascular hypertension, Pt. on medications +CHF  Normal cardiovascular exam  ECHO: 1. Global hypokinesis wiht akinesis of the basal to mid inferoseptum,  inferior and posterolateral myocardium. Left ventricular ejection  fraction, by estimation, is 20 to 25%. The left ventricle has severely  decreased function. The left ventricle  demonstrates regional wall motion abnormalities (see scoring  diagram/findings for description). There is mild concentric left  ventricular hypertrophy. Left ventricular diastolic parameters are  indeterminate.   2. Right ventricular systolic function is normal. The right ventricular  size is normal. There is normal pulmonary artery systolic pressure.   3. Left atrial size was severely dilated.   4. The mitral valve is normal in structure. No evidence of mitral valve  regurgitation. No evidence of mitral stenosis.   5. The aortic valve is tricuspid. Aortic valve regurgitation is not  visualized. No aortic stenosis is present.   6. Aortic dilatation noted. There is mild dilatation of the ascending  aorta, measuring 40 mm.   7. The inferior vena cava is normal in size with greater than 50%  respiratory variability, suggesting right atrial pressure of 3 mmHg.     Neuro/Psych  PSYCHIATRIC DISORDERS  Depression     Neuromuscular disease    GI/Hepatic negative GI ROS,,,(+)     substance abuse    Endo/Other  diabetes, Oral Hypoglycemic Agents    Renal/GU negative Renal ROS     Musculoskeletal negative musculoskeletal  ROS (+)    Abdominal  (+) + obese  Peds  Hematology negative hematology ROS (+)   Anesthesia Other Findings Heart faliure  Reproductive/Obstetrics                              Anesthesia Physical Anesthesia Plan  ASA: 4  Anesthesia Plan: MAC   Post-op Pain Management:    Induction:   PONV Risk Score and Plan: 0 and Treatment may vary due to age or medical condition  Airway Management Planned: Nasal Cannula  Additional Equipment:   Intra-op Plan:   Post-operative Plan:   Informed Consent: I have reviewed the patients History and Physical, chart, labs and discussed the procedure including the risks, benefits and alternatives for the proposed anesthesia with the patient or authorized representative who has indicated his/her understanding and acceptance.     Dental advisory given  Plan Discussed with: CRNA  Anesthesia Plan Comments:         Anesthesia Quick Evaluation

## 2024-04-23 NOTE — Plan of Care (Signed)
   Problem: Education: Goal: Knowledge of General Education information will improve Description: Including pain rating scale, medication(s)/side effects and non-pharmacologic comfort measures Outcome: Progressing   Problem: Activity: Goal: Risk for activity intolerance will decrease Outcome: Progressing

## 2024-04-23 NOTE — Transfer of Care (Signed)
 Immediate Anesthesia Transfer of Care Note  Patient: Charles Nunez  Procedure(s) Performed: MRI WITH ANESTHESIA  Patient Location: PACU  Anesthesia Type:MAC  Level of Consciousness: awake, alert , oriented, and patient cooperative  Airway & Oxygen Therapy: Patient Spontanous Breathing and Patient connected to nasal cannula oxygen  Post-op Assessment: Report given to RN, Post -op Vital signs reviewed and stable, and Patient moving all extremities X 4  Post vital signs: Reviewed and stable  Last Vitals:  Vitals Value Taken Time  BP 84/57 04/23/24 12:41  Temp    Pulse 84 04/23/24 12:45  Resp 15 04/23/24 12:45  SpO2 90 % 04/23/24 12:45  Vitals shown include unfiled device data.  Last Pain:  Vitals:   04/23/24 1101  TempSrc:   PainSc: 0-No pain         Complications: No notable events documented.

## 2024-04-23 NOTE — Plan of Care (Signed)
  Problem: Health Behavior/Discharge Planning: Goal: Ability to manage health-related needs will improve Outcome: Progressing   Problem: Clinical Measurements: Goal: Diagnostic test results will improve Outcome: Progressing   Problem: Activity: Goal: Risk for activity intolerance will decrease Outcome: Progressing   Problem: Nutrition: Goal: Adequate nutrition will be maintained Outcome: Progressing   

## 2024-04-23 NOTE — Progress Notes (Signed)
 [facility] Progress Note Patient: Charles Nunez FMW:[id number] DOB: 09-04-1960  DOA: 04/17/2024 DOS: the patient was seen and examined on 04/23/2024  Brief Hospital Course: Charles Nunez is a 63 y.o. male with PMH of  Dm2, HTN OSA, active tobacco abuse who presented with increased shortness of breath, cough,wheezing and increased work of breathing since 04/16/24. CT angio chest negative for PE or pneumonia, findings of acute bronchitis. Echocardiogram shows LVEF 20 to 25%.  Patient receiving diuresis with IV Lasix.  Underwent right and left heart catheterization per cardiology.  No CAD.   Assessment and Plan: Acute on chronic diastolic CHF Nonischemic cardiomyopathy Presented w/ shortness of breath cough and abnormal BNP 64m, trop up-flat chest x-ray/CTA no PE, question bronchitis Echocardiogram shows LVEF 20 to 25%, regional wall motion abnormalities, normal RV function. Left and right heart cath 11/7: No CAD, severe NICM EF 20 to 25%, well compensated filling pressure with moderate to severely depressed CO due to biventricular failure GDMT: Will be continued. Cardiac MRI ordered although patient unable to tolerate it due to machine being warm. Will defer to cardiology with regards to need for further workup.   Hypercholesterolemia: Not on meds.   HTN: Blood pressure stable.  Monitor.   DM 2: PTA on  PO meds only.  Continue SSI for now,    OSA: noncompliant with CPAP    Obesity Class 2 Body mass index is 38.76 kg/m.  Placing the pt at higher risk of poor outcomes.   Subjective: No nausea or vomiting.  No fever no chills.  No passing out events.  Physical Exam: Basal crackles. S1-S2 present Bowel sound present Trace edema. Alert awake and oriented x 3.  Data Reviewed: I have Reviewed nursing notes, Vitals, and Lab results. Since last encounter, pertinent lab results CBC and BMP   . I have ordered test including CBC and BMP  . I have discussed pt's care plan and test  results with cardiology  .   Disposition: Status is: Inpatient Remains inpatient appropriate because: Further workup per cardiology.  Patient wants to go home.  SCDs Start: 04/17/24 2344   Family Communication: Family at bedside Level of care: Telemetry   Vitals:   04/23/24 1315 04/23/24 1330 04/23/24 1426 04/23/24 1600  BP: 101/89 (!) 116/91 (!) 125/95 121/71  Pulse: 93 91  92  Resp: (!) 22 17 16 16   Temp: 97.7 F (36.5 C)   98.1 F (36.7 C)  TempSrc:    Oral  SpO2: 96% 96% 100% 96%  Weight:      Height:         Author: Yetta Blanch, MD 04/23/2024 5:55 PM  Please look on [email or web address] to find out who is on call.

## 2024-04-23 NOTE — Progress Notes (Addendum)
 Advanced Heart Failure Rounding Note  HF Cardiologist: Dr. Cherrie  Chief Complaint: HFrEF  Subjective:    Feeling okay today. Notes mild orthopnea but significantly improved from admission.   Planning for cMRI today if can be completed with sedation.   Objective:   Weight Range: 124.6 kg Body mass index is 39.41 kg/m.   Vital Signs:   Temp:  [97.7 F (36.5 C)-98.2 F (36.8 C)] 97.8 F (36.6 C) (11/10 0732) Pulse Rate:  [85-98] 85 (11/10 0732) Resp:  [17-20] 17 (11/10 0732) BP: (105-130)/(75-96) 105/87 (11/10 0732) SpO2:  [88 %-98 %] 88 % (11/10 0732) Weight:  [124.1 kg-124.6 kg] 124.6 kg (11/10 0506) Last BM Date : 04/22/24  Weight change: Filed Weights   04/21/24 0132 04/22/24 0833 04/23/24 0506  Weight: 125.2 kg 124.1 kg 124.6 kg    Intake/Output:   Intake/Output Summary (Last 24 hours) at 04/23/2024 0803 Last data filed at 04/22/2024 2200 Gross per 24 hour  Intake 960 ml  Output 700 ml  Net 260 ml      Physical Exam    General:  Chronically ill appearing Cor: JVP difficult d/t thick neck. Regular rate & rhythm. No murmurs. Lungs: Clear, breathing nonlabored Abdomen: obese, nondistended Extremities: No edema Neuro: Alert & orientedx3. Affect pleasant   Telemetry   SR/ST 90s-100s  Labs    CBC Recent Labs    04/21/24 0246 04/22/24 0233 04/23/24 0254  WBC 9.0 9.5 9.7  NEUTROABS 5.1  --   --   HGB 15.7 15.3 15.9  HCT 48.7 47.3 49.4  MCV 91.0 90.4 90.6  PLT 239 251 267   Basic Metabolic Panel Recent Labs    88/90/74 0233 04/23/24 0254  NA 138 137  K 4.7 4.7  CL 93* 95*  CO2 33* 33*  GLUCOSE 210* 173*  BUN 29* 31*  CREATININE 1.57* 1.60*  CALCIUM 9.2 9.3   Liver Function Tests No results for input(s): AST, ALT, ALKPHOS, BILITOT, PROT, ALBUMIN in the last 72 hours. No results for input(s): LIPASE, AMYLASE in the last 72 hours. Cardiac Enzymes No results for input(s): CKTOTAL, CKMB, CKMBINDEX,  TROPONINI in the last 72 hours.  BNP: BNP (last 3 results) No results for input(s): BNP in the last 8760 hours.  ProBNP (last 3 results) Recent Labs    04/17/24 0712  PROBNP 613.0*     D-Dimer No results for input(s): DDIMER in the last 72 hours. Hemoglobin A1C No results for input(s): HGBA1C in the last 72 hours. Fasting Lipid Panel Recent Labs    04/21/24 0246  CHOL 259*  HDL 46  LDLCALC 175*  TRIG 192*  CHOLHDL 5.6   Thyroid  Function Tests No results for input(s): TSH, T4TOTAL, T3FREE, THYROIDAB in the last 72 hours.  Invalid input(s): FREET3  Other results:   Imaging    No results found.   Medications:     Scheduled Medications:  aspirin EC  81 mg Oral Daily   empagliflozin  10 mg Oral Daily   enoxaparin (LOVENOX) injection  60 mg Subcutaneous Daily   furosemide  40 mg Oral Daily   guaiFENesin  600 mg Oral BID   insulin  aspart  0-15 Units Subcutaneous TID WC   losartan  50 mg Oral Daily   nicotine  14 mg Transdermal Daily   pregabalin  50 mg Oral TID   rosuvastatin  20 mg Oral Daily   sodium chloride flush  3 mL Intravenous Q12H   sodium chloride flush  3 mL  Intravenous Q12H    Infusions:   PRN Medications: acetaminophen  **OR** acetaminophen , albuterol, HYDROcodone -acetaminophen , hydrOXYzine, iohexol, ondansetron  **OR** ondansetron  (ZOFRAN ) IV, sodium chloride flush, sodium chloride flush  Assessment/Plan   Acute Systolic Heart Failure - new diagnosis.  - Echo with EF 20-25%, RV okay - R/LHC 11/25: EF no CAD, well compensated filling pressures with severe biventricular failure, Fick CI 1.9, TD CI 2.4, PAPi 1.6 - etiology unknown. Suspect HTN CM may be playing a role. No ETOH, reports cocaine use but not often. Denies family history of HF.  - cMRI pending. He is extremely claustrophobic, this would need to be with sedation. Would require continuous monitoring. - Volume okay on exam. Scr worse than baseline, received  contrast x 2 for CTA and cath. Hold diuretic today - continue jardiance 10 mg daily - continue losartan 50 mg daily - Titrate GDMT as able depending on renal function   HLD - no CAD on cath - LDL is 175, Lpa is 136 - Start Atorvastatin 80   AKI - baseline Cr normal - trended up to 1.6 - hold diuretics today   HTN - GDMT as above   OSA/suspect OHS - not treated prior to admission. Reports he never received all of the supplies   Cocaine use - discussed cessation   Length of Stay: 5  FINCH, LINDSAY N, PA-C  04/23/2024, 8:03 AM  Advanced Heart Failure Team Pager [phone] (M-F; 7a - 5p)  Please contact [facility] for night-coverage after hours (5p -7a ) and weekends on [email or web address]  Patient seen with PA, I formulated the plan and agree with the above note.   Creatinine stable today at 1.6, still above baseline.  BP stable.   No dyspnea.   General: NAD Neck: Thick, JVP ?8 cm, no thyromegaly or thyroid  nodule.  Lungs: Clear to auscultation bilaterally with normal respiratory effort. CV: Nondisplaced PMI.  Heart regular S1/S2, no S3/S4, no murmur.  No peripheral edema.   Abdomen: Soft, nontender, no hepatosplenomegaly, no distention.  Skin: Intact without lesions or rashes.  Neurologic: Alert and oriented x 3.  Psych: Normal affect. Extremities: No clubbing or cyanosis.  HEENT: Normal.   Nonischemic cardiomyopathy, to get cardiac MRI today.  Will need gentle sedation given claustrophobia.   Volume status looks ok today but I suspect that he will need to start on oral diuretic tomorrow.  Continue Jardiance.  I will stop losartan and start Entresto 24/26 bid this evening.  Additional GDMT will depend on creatinine trend.   Creatinine above baseline but stable, hopefully will start to trend back down.   Ezra Shuck 04/23/2024 10:08 AM

## 2024-04-24 ENCOUNTER — Other Ambulatory Visit (HOSPITAL_COMMUNITY): Payer: Self-pay

## 2024-04-24 ENCOUNTER — Encounter (HOSPITAL_COMMUNITY): Payer: Self-pay | Admitting: Radiology

## 2024-04-24 DIAGNOSIS — I5021 Acute systolic (congestive) heart failure: Secondary | ICD-10-CM | POA: Diagnosis not present

## 2024-04-24 LAB — BASIC METABOLIC PANEL WITH GFR
Anion gap: 10 (ref 5–15)
BUN: 27 mg/dL — ABNORMAL HIGH (ref 8–23)
CO2: 31 mmol/L (ref 22–32)
Calcium: 9.4 mg/dL (ref 8.9–10.3)
Chloride: 96 mmol/L — ABNORMAL LOW (ref 98–111)
Creatinine, Ser: 1.41 mg/dL — ABNORMAL HIGH (ref 0.61–1.24)
GFR, Estimated: 56 mL/min — ABNORMAL LOW (ref >60)
Glucose, Bld: 199 mg/dL — ABNORMAL HIGH (ref 70–99)
Potassium: 5.3 mmol/L — ABNORMAL HIGH (ref 3.5–5.1)
Sodium: 137 mmol/L (ref 135–145)

## 2024-04-24 LAB — GLUCOSE, CAPILLARY
Glucose-Capillary: 211 mg/dL — ABNORMAL HIGH (ref 70–99)
Glucose-Capillary: 158 mg/dL — ABNORMAL HIGH (ref 70–99)

## 2024-04-24 LAB — POTASSIUM: Potassium: 4.9 mmol/L (ref 3.5–5.1)

## 2024-04-24 MED ORDER — ATORVASTATIN CALCIUM 80 MG PO TABS
80.0000 mg | ORAL_TABLET | Freq: Every day | ORAL | 0 refills | Status: AC
  Filled 2024-04-24: qty 30, 30d supply, fill #0

## 2024-04-24 MED ORDER — FUROSEMIDE 40 MG PO TABS
40.0000 mg | ORAL_TABLET | Freq: Every day | ORAL | Status: DC
  Administered 2024-04-24: 40 mg via ORAL
  Filled 2024-04-24: qty 1

## 2024-04-24 MED ORDER — DIGOXIN 125 MCG PO TABS
0.1250 mg | ORAL_TABLET | Freq: Every day | ORAL | 0 refills | Status: DC
  Filled 2024-04-24: qty 30, 30d supply, fill #0

## 2024-04-24 MED ORDER — FUROSEMIDE 40 MG PO TABS
40.0000 mg | ORAL_TABLET | Freq: Every day | ORAL | 0 refills | Status: DC
  Filled 2024-04-24: qty 30, 30d supply, fill #0

## 2024-04-24 MED ORDER — PREGABALIN 50 MG PO CAPS
50.0000 mg | ORAL_CAPSULE | Freq: Three times a day (TID) | ORAL | 0 refills | Status: AC
  Filled 2024-04-24: qty 90, 30d supply, fill #0

## 2024-04-24 MED ORDER — SACUBITRIL-VALSARTAN 24-26 MG PO TABS
1.0000 | ORAL_TABLET | Freq: Two times a day (BID) | ORAL | 0 refills | Status: DC
  Filled 2024-04-24: qty 60, 30d supply, fill #0

## 2024-04-24 MED ORDER — EMPAGLIFLOZIN 10 MG PO TABS
10.0000 mg | ORAL_TABLET | Freq: Every day | ORAL | 0 refills | Status: DC
  Filled 2024-04-24: qty 30, 30d supply, fill #0

## 2024-04-24 MED ORDER — NICOTINE 14 MG/24HR TD PT24
14.0000 mg | MEDICATED_PATCH | Freq: Every day | TRANSDERMAL | 0 refills | Status: AC
  Filled 2024-04-24: qty 28, 28d supply, fill #0

## 2024-04-24 MED ORDER — DIGOXIN 125 MCG PO TABS
0.1250 mg | ORAL_TABLET | Freq: Every day | ORAL | Status: DC
  Administered 2024-04-24: 0.125 mg via ORAL
  Filled 2024-04-24: qty 1

## 2024-04-24 MED ORDER — SODIUM ZIRCONIUM CYCLOSILICATE 10 G PO PACK
10.0000 g | PACK | Freq: Two times a day (BID) | ORAL | Status: DC
  Administered 2024-04-24: 10 g via ORAL
  Filled 2024-04-24: qty 1

## 2024-04-24 NOTE — Anesthesia Postprocedure Evaluation (Signed)
 Anesthesia Post Note  Patient: Charles Nunez  Procedure(s) Performed: MRI WITH ANESTHESIA     Patient location during evaluation: PACU Anesthesia Type: MAC Level of consciousness: awake Pain management: pain level controlled Vital Signs Assessment: post-procedure vital signs reviewed and stable Respiratory status: spontaneous breathing, nonlabored ventilation and respiratory function stable Cardiovascular status: blood pressure returned to baseline and stable Postop Assessment: no apparent nausea or vomiting Anesthetic complications: no   No notable events documented.  Last Vitals:  Vitals:   04/24/24 0735 04/24/24 1130  BP: 114/80 111/71  Pulse: 96 90  Resp: 20 16  Temp: 36.7 C 37.1 C  SpO2: 100% 96%    Last Pain:  Vitals:   04/24/24 1130  TempSrc: Oral  PainSc:                  Maryrose Colvin P Aleli Navedo

## 2024-04-24 NOTE — Progress Notes (Signed)
 Mobility Specialist Progress Note:    04/24/24 1215  Mobility  Activity Ambulated independently  Level of Assistance Independent after set-up  Assistive Device None  Distance Ambulated (ft) 200 ft  Range of Motion/Exercises Active  Activity Response Tolerated well  Mobility Referral Yes  Mobility visit 1 Mobility  Mobility Specialist Start Time (ACUTE ONLY) 1215  Mobility Specialist Stop Time (ACUTE ONLY) 1222  Mobility Specialist Time Calculation (min) (ACUTE ONLY) 7 min   Received pt ambulating in room agreeable to session. No c/o any symptoms. PT moving and ambulating well. Returned pt to room still ambulating w/ all needs met.   Venetia Keel Mobility Specialist Please Neurosurgeon or Rehab Office at [phone]

## 2024-04-24 NOTE — Progress Notes (Addendum)
 Advanced Heart Failure Rounding Note  HF Cardiologist: Dr. Cherrie  Chief Complaint: HFrEF  Subjective:    Unable to complete cardiac MRI d/t severe claustrophobia.  Scr 1.6>1.4. K high at 5.3, did not get K supp.  Feeling great. No dyspnea. Has ambulated the halls without shortness of breath.  Objective:   Weight Range: 125.1 kg Body mass index is 39.01 kg/m.   Vital Signs:   Temp:  [97.7 F (36.5 C)-98.5 F (36.9 C)] 98 F (36.7 C) (11/11 0735) Pulse Rate:  [85-100] 96 (11/11 0735) Resp:  [14-22] 20 (11/11 0735) BP: (84-125)/(57-95) 114/80 (11/11 0735) SpO2:  [91 %-100 %] 100 % (11/11 0735) Weight:  [124.3 kg-125.1 kg] 125.1 kg (11/11 0544) Last BM Date : 04/23/24  Weight change: Filed Weights   04/23/24 0506 04/23/24 1042 04/24/24 0544  Weight: 124.6 kg 124.3 kg 125.1 kg    Intake/Output:   Intake/Output Summary (Last 24 hours) at 04/24/2024 0747 Last data filed at 04/23/2024 2000 Gross per 24 hour  Intake 340 ml  Output --  Net 340 ml      Physical Exam    General:  Lying flat in bed. No distress. Cor: JVP difficult. Regular rate & rhythm. No murmurs. Lungs: clear Abdomen: obese, nondistended Extremities: no edema Neuro: alert & orientedx3. Affect pleasant    Telemetry   SR 90s  Labs    CBC Recent Labs    04/22/24 0233 04/23/24 0254  WBC 9.5 9.7  HGB 15.3 15.9  HCT 47.3 49.4  MCV 90.4 90.6  PLT 251 267   Basic Metabolic Panel Recent Labs    88/89/74 0254 04/24/24 0250  NA 137 137  K 4.7 5.3*  CL 95* 96*  CO2 33* 31  GLUCOSE 173* 199*  BUN 31* 27*  CREATININE 1.60* 1.41*  CALCIUM 9.3 9.4   Liver Function Tests No results for input(s): AST, ALT, ALKPHOS, BILITOT, PROT, ALBUMIN in the last 72 hours. No results for input(s): LIPASE, AMYLASE in the last 72 hours. Cardiac Enzymes No results for input(s): CKTOTAL, CKMB, CKMBINDEX, TROPONINI in the last 72 hours.  BNP: BNP (last 3  results) No results for input(s): BNP in the last 8760 hours.  ProBNP (last 3 results) Recent Labs    04/17/24 0712  PROBNP 613.0*     D-Dimer No results for input(s): DDIMER in the last 72 hours. Hemoglobin A1C No results for input(s): HGBA1C in the last 72 hours. Fasting Lipid Panel No results for input(s): CHOL, HDL, LDLCALC, TRIG, CHOLHDL, LDLDIRECT in the last 72 hours.  Thyroid  Function Tests No results for input(s): TSH, T4TOTAL, T3FREE, THYROIDAB in the last 72 hours.  Invalid input(s): FREET3  Other results:   Imaging    No results found.   Medications:     Scheduled Medications:  aspirin EC  81 mg Oral Daily   atorvastatin  80 mg Oral Daily   empagliflozin  10 mg Oral Daily   enoxaparin (LOVENOX) injection  60 mg Subcutaneous Daily   guaiFENesin  600 mg Oral BID   insulin  aspart  0-15 Units Subcutaneous TID WC   nicotine  14 mg Transdermal Daily   pregabalin  50 mg Oral TID   sacubitril-valsartan  1 tablet Oral BID   sodium chloride flush  3 mL Intravenous Q12H   sodium chloride flush  3 mL Intravenous Q12H    Infusions:   PRN Medications: acetaminophen  **OR** acetaminophen , albuterol, HYDROcodone -acetaminophen , hydrOXYzine, ondansetron  **OR** ondansetron  (ZOFRAN ) IV, sodium chloride flush, sodium  chloride flush  Assessment/Plan   Acute Systolic Heart Failure - new diagnosis.  - Echo with EF 20-25%, RV okay - R/LHC 11/25: EF no CAD, well compensated filling pressures with severe biventricular failure, Fick CI 1.9, TD CI 2.4, PAPi 1.6 - etiology unknown. Suspect HTN CM may be playing a role. No ETOH, reports occasional cocaine use. Denies family history of HF.  - unable to complete cMRI d/t severe claustrophobia - volume okay. Start po lasix 40 mg daily - start digoxin 0.125 mg daily - continue jardiance 10 mg daily - continue Entresto 24/26 bid mg daily - No beta blocker with low-output - Eventually add spiro.  Expect K to improve once on daily diuretic.   HLD - no CAD on cath - LDL is 175, Lpa is 136 - Continue Atorvastatin 80   AKI - baseline Cr normal - trended up to 1.6 after contrast X 2 + HF decompensation, down to 1.4 today   HTN - BP okay - GDMT as above   Severe OSA/suspect OHS - not treated prior to admission. Reports he never received all of the supplies.  - Will need to refer back to pulmonary as outpatient. Last sleep study in 2023.   Cocaine use - discussed cessation  He does not have CAD. Will stop Aspirin.  Expect he is ready for discharge. Will review with Dr. Sugar Vanzandt. Has f/u scheduled in HF clinic.   Length of Stay: 6  FINCH, LINDSAY N, PA-C  04/24/2024, 7:47 AM  Advanced Heart Failure Team Pager [phone] (M-F; 7a - 5p)  Please contact [facility] for night-coverage after hours (5p -7a ) and weekends on [email or web address]  Patient seen with PA, I formulated the plan and agree with the above note.   Creatinine 1.6 => 1.41.  BP stable.  K 5.3.  No complaints this morning.   General: NAD Neck: Thick. No JVD, no thyromegaly or thyroid  nodule.  Lungs: Clear to auscultation bilaterally with normal respiratory effort. CV: Nondisplaced PMI.  Heart regular S1/S2, no S3/S4, no murmur.  No peripheral edema.   Abdomen: Soft, nontender, no hepatosplenomegaly, no distention.  Skin: Intact without lesions or rashes.  Neurologic: Alert and oriented x 3.  Psych: Normal affect. Extremities: No clubbing or cyanosis.  HEENT: Normal.   Patient looks near euvolemic.  BP stable.  - Continue Entresto 24/26 bid.  - Start Lasix 40 mg daily, suspect with creatinine trending down and Lasix starting, K will be ok.  Can stop Lokelma.  - Add digoxin - Continue Jardiance.  - No spironolactone yet with K elevated.   Needs to see pulmonary as outpatient to get on CPAP.   Needs cocaine and smoking cessation.   Followup CHF clinic, needs BMET early next week to follow K.  Meds for  home: Entresto 24/26 bid, Lasix 40 mg daily, Jardiance 10 mg daily, digoxin 0.125 daily, atorvastatin 80 daily.  Does not need ASA (no CAD on cath).   Ezra Shuck 04/24/2024 9:02 AM

## 2024-04-24 NOTE — Discharge Summary (Signed)
 Physician Discharge Summary   Patient: Charles Nunez MRN: [id number] DOB: 1960-10-15  Admit date:     04/17/2024  Discharge date: 04/24/24  Discharge Physician: Yetta Blanch  PCP: [facility]  Recommendations at discharge: Follow-up with PCP in 1 week. Repeat BMP in 1 week. Follow-up with cardiology as recommended. Consider checking digoxin level.   Follow-up Information     [facility], [address]. Schedule an appointment as soon as possible for a visit in 1 week(s).   Why: with BMP lab to look at kidney/electrolyte numbers Contact information: [address] [place] [address] [phone]                Hospital Course: Charles Nunez is a 63 y.o. male with PMH of  Dm2, HTN OSA, active tobacco abuse who presented with increased shortness of breath, cough,wheezing and increased work of breathing since 04/16/24. CT angio chest negative for PE or pneumonia, findings of acute bronchitis. Echocardiogram shows LVEF 20 to 25%.  Patient receiving diuresis with IV Lasix.  Underwent right and left heart catheterization per cardiology.  No CAD.   Assessment and Plan: Acute on chronic diastolic CHF Nonischemic cardiomyopathy Presented w/ shortness of breath cough and abnormal BNP 692m, trop up-flat chest x-ray/CTA no PE, question bronchitis Echocardiogram shows LVEF 20 to 25%, regional wall motion abnormalities, normal RV function. Left and right heart cath 11/7: No CAD, severe NICM EF 20 to 25%, well compensated filling pressure with moderate to severely depressed CO due to biventricular failure Cardiac MRI ordered although patient unable to tolerate it due to machine being warm. Outpatient follow-up with cardiology recommended. Continue current medication.. Recommend Recheck digoxin level given his CKD. Recommend to repeat BMP in 1 week as well.   Hypercholesterolemia: Not on meds.   HTN: Blood pressure stable.  Monitor.   DM 2, well-controlled without any long-term  insulin  use with CKD PTA on  PO meds only.  Resume.   OSA: noncompliant with CPAP    Obesity Class 2 Body mass index is 39.01 kg/m.  Placing the pt at higher risk of poor outcomes.  Hyperkalemia. Treated with Lokelma. Holding lisinopril. Not a candidate for Aldactone due to elevated potassium.  HLD. Started on statin.  Mood disorder. On Celexa.  Active smoker. Provided nicotine patch.  Diabetic neuropathy. Was on Lyrica. Dose adjusted for renal function.  Consultants:  Cardiology  Procedures performed:  Right and left heart Cardiac Catheterization Echocardiogram  DISCHARGE MEDICATION: Allergies as of 04/24/2024       Reactions   Vancomycin Other (See Comments)   Other reaction(s): Red Man Syndrome (ALLERGY), Redness Red man syndrome Per patient.  Patient denies anaphylaxis as mentioned in OSH records        Medication List     STOP taking these medications    eszopiclone  2 MG Tabs tablet Commonly known as: LUNESTA    Farxiga 10 MG Tabs tablet Generic drug: dapagliflozin propanediol   lisinopril 40 MG tablet Commonly known as: ZESTRIL       TAKE these medications    acetaminophen  650 MG CR tablet Commonly known as: TYLENOL  Take 650 mg by mouth every 8 (eight) hours as needed for pain.   atorvastatin 80 MG tablet Commonly known as: LIPITOR Take 1 tablet (80 mg total) by mouth daily. Start taking on: April 25, 2024   citalopram 20 MG tablet Commonly known as: CELEXA Take 20 mg by mouth daily.   digoxin 0.125 MG tablet Commonly known as: LANOXIN Take 1 tablet (0.125  mg total) by mouth daily. Start taking on: April 25, 2024   Entresto 24-26 MG Generic drug: sacubitril-valsartan Take 1 tablet by mouth 2 (two) times daily.   furosemide 40 MG tablet Commonly known as: LASIX Take 1 tablet (40 mg total) by mouth daily. Take additional 40 mg for weight gain of 3 lbs in 1 day or 5 lbs in 1 week Start taking on: April 25, 2024    glipiZIDE 10 MG 24 hr tablet Commonly known as: GLUCOTROL XL Take 10 mg by mouth every morning.   HYDROcodone -acetaminophen  5-325 MG tablet Commonly known as: NORCO/VICODIN Take 1 tablet by mouth every 4 (four) hours as needed.   Jardiance 10 MG Tabs tablet Generic drug: empagliflozin Take 1 tablet (10 mg total) by mouth daily. Start taking on: April 25, 2024   nicotine 14 mg/24hr patch Commonly known as: NICODERM CQ - dosed in mg/24 hours Place 1 patch (14 mg total) onto the skin daily. Start taking on: April 25, 2024   pregabalin 50 MG capsule Commonly known as: LYRICA Take 1 capsule (50 mg total) by mouth 3 (three) times daily. What changed:  medication strength how much to take       Disposition: Home Diet recommendation: Cardiac diet  Discharge Exam: Vitals:   04/24/24 0343 04/24/24 0544 04/24/24 0735 04/24/24 1130  BP: 123/88  114/80 111/71  Pulse: 100  96 90  Resp: 18  20 16   Temp: 98.5 F (36.9 C)  98 F (36.7 C) 98.7 F (37.1 C)  TempSrc: Oral  Oral Oral  SpO2: 98%  100% 96%  Weight:  125.1 kg    Height:       General: in Mild distress, No Rash Cardiovascular: S1 and S2 Present, No Murmur Respiratory: Good respiratory effort, Bilateral Air entry present.  Basal crackles, No wheezes Abdomen: Bowel Sound present, No tenderness Extremities: Bilateral edema Neuro: Alert and oriented x3, no new focal deficit   Filed Weights   04/23/24 0506 04/23/24 1042 04/24/24 0544  Weight: 124.6 kg 124.3 kg 125.1 kg   Condition at discharge: stable  The results of significant diagnostics from this hospitalization (including imaging, microbiology, ancillary and laboratory) are listed below for reference.   Imaging Studies: CARDIAC CATHETERIZATION Result Date: 04/20/2024   The left ventricular ejection fraction is less than 25% by visual estimate. Findings: Ao = 101/87 (94) LV = 103/17 RA =  7 RV = 39/10 PA = 40/28 (32) PCW = 17 Fick cardiac output/index =  4.5/1.9 Thermo CO/CI = 5.7/2.4 PVR = 3.0 WU Ao sat = 87% PA sat = 52% PAPi = 1.6 Assessment: 1. Left-dominant coronary system with no CAD 2. 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 Well compensated filling pressures with moderate to severely depressed co due to biventricular failure 4. Likely OHS Plan/Discussion: He has severe NICM. With low output. Titrate GDMT. Add digoxin. Check cMRI. Charles Fuel, MD 2:14 PM  ECHOCARDIOGRAM COMPLETE Result Date: 04/18/2024    ECHOCARDIOGRAM REPORT   Patient Name:   Charles Nunez Date of Exam: 04/18/2024 Medical Rec #:  [id number]    Height:       70.0 in Accession #:    [id number]   Weight:       277.0 lb Date of Birth:  08-26-1960    BSA:          2.397 m Patient Age:    63 years     BP:           107/62 mmHg Patient  Gender: M            HR:           100 bpm. Exam Location:  Inpatient Procedure: 2D Echo and Intracardiac Opacification Agent (Both Spectral and Color            Flow Doppler were utilized during procedure). Indications:    CHF  History:        Patient has no prior history of Echocardiogram examinations.  Sonographer:    Charmaine Gaskins Referring Phys: ANASTASSIA DOUTOVA IMPRESSIONS  1. Global hypokinesis wiht akinesis of the basal to mid inferoseptum, inferior and posterolateral myocardium. Left ventricular ejection fraction, by estimation, is 20 to 25%. The left ventricle has severely decreased function. The left ventricle demonstrates regional wall motion abnormalities (see scoring diagram/findings for description). There is mild concentric left ventricular hypertrophy. Left ventricular diastolic parameters are indeterminate.  2. Right ventricular systolic function is normal. The right ventricular size is normal. There is normal pulmonary artery systolic pressure.  3. Left atrial size was severely dilated.  4. The mitral valve is normal in structure. No evidence of mitral valve regurgitation. No evidence of mitral stenosis.  5. The aortic valve is tricuspid. Aortic valve  regurgitation is not visualized. No aortic stenosis is present.  6. Aortic dilatation noted. There is mild dilatation of the ascending aorta, measuring 40 mm.  7. The inferior vena cava is normal in size with greater than 50% respiratory variability, suggesting right atrial pressure of 3 mmHg. FINDINGS  Left Ventricle: Global hypokinesis wiht akinesis of the basal to mid inferoseptum, inferior and posterolateral myocardium. Left ventricular ejection fraction, by estimation, is 20 to 25%. The left ventricle has severely decreased function. The left ventricle demonstrates regional wall motion abnormalities. Definity contrast agent was given IV to delineate the left ventricular endocardial borders. The left ventricular internal cavity size was normal in size. There is mild concentric left ventricular  hypertrophy. Left ventricular diastolic parameters are indeterminate.  LV Wall Scoring: The inferior wall, posterior wall, mid inferoseptal segment, and basal inferoseptal segment are akinetic. The entire anterior wall, antero-lateral wall, entire anterior septum, and entire apex are hypokinetic. Right Ventricle: The right ventricular size is normal. No increase in right ventricular wall thickness. Right ventricular systolic function is normal. There is normal pulmonary artery systolic pressure. The tricuspid regurgitant velocity is 1.87 m/s, and  with an assumed right atrial pressure of 3 mmHg, the estimated right ventricular systolic pressure is 17.0 mmHg. Left Atrium: Left atrial size was severely dilated. Right Atrium: Right atrial size was normal in size. Pericardium: There is no evidence of pericardial effusion. Mitral Valve: The mitral valve is normal in structure. No evidence of mitral valve regurgitation. No evidence of mitral valve stenosis. Tricuspid Valve: The tricuspid valve is normal in structure. Tricuspid valve regurgitation is trivial. No evidence of tricuspid stenosis. Aortic Valve: The aortic valve is  tricuspid. Aortic valve regurgitation is not visualized. No aortic stenosis is present. Pulmonic Valve: The pulmonic valve was normal in structure. Pulmonic valve regurgitation is not visualized. No evidence of pulmonic stenosis. Aorta: Aortic dilatation noted. There is mild dilatation of the ascending aorta, measuring 40 mm. Venous: The inferior vena cava is normal in size with greater than 50% respiratory variability, suggesting right atrial pressure of 3 mmHg. IAS/Shunts: No atrial level shunt detected by color flow Doppler.  LEFT VENTRICLE PLAX 2D LVIDd:         5.80 cm LVIDs:  5.30 cm LV PW:         1.20 cm LV IVS:        1.10 cm LVOT diam:     2.40 cm LVOT Area:     4.52 cm  RIGHT VENTRICLE RV Basal diam:  3.20 cm RV Mid diam:    2.80 cm RV S prime:     11.70 cm/s TAPSE (M-mode): 1.7 cm LEFT ATRIUM              Index        RIGHT ATRIUM           Index LA diam:        4.40 cm  1.84 cm/m   RA Area:     16.20 cm LA Vol (A2C):   105.0 ml 43.80 ml/m  RA Volume:   43.70 ml  18.23 ml/m LA Vol (A4C):   99.2 ml  41.38 ml/m LA Biplane Vol: 106.0 ml 44.22 ml/m   AORTA Ao Root diam: 3.60 cm Ao Asc diam:  3.95 cm TRICUSPID VALVE TR Peak grad:   14.0 mmHg TR Vmax:        187.00 cm/s  SHUNTS Systemic Diam: 2.40 cm Annabella Scarce MD Electronically signed by Annabella Scarce MD Signature Date/Time: 04/18/2024/2:58:31 PM    Final    CT Angio Chest Pulmonary Embolism (PE) W or WO Contrast Result Date: 04/18/2024 EXAM: LUNG CANCER SCREENING CHEST CT WITH AND WITHOUT CONTRAST 04/18/2024 05:04:45 AM TECHNIQUE: Low-dose CT of the chest was performed with and without the administration of 75 mL of iohexol (OMNIPAQUE) 350 MG/ML injection. Multiplanar reformatted images are provided for review. Automated exposure control, iterative reconstruction, and/or weight based adjustment of the mA/kV was utilized to reduce the radiation dose to as low as reasonably achievable. COMPARISON: Chest radiographs 04/17/2024. CT  abdomen and pelvis 03/28/2017. CLINICAL HISTORY: 63 year old male. Pulmonary embolism (PE) suspected, high probability. FINDINGS: MEDIASTINUM: Borderline cardiomegaly. No pericardial effusion. Little contrast in the aorta. Mild for age calcified aortic atherosclerosis. LYMPH NODES: No mediastinal, hilar or axillary lymphadenopathy. LUNGS AND PLEURA: Good pulmonary artery contrast timing. Minor respiratory motion, which is limited to the lung bases. Central pulmonary arteries are normally enhancing. No convincing distal pulmonary artery filling defect. Small bilateral layering pleural effusions simple fluid density favoring transudate. There is generalized bronchial wall thickening most apparent at the hila. Atelectatic changes to the central airways and bilateral bronchi narrowing. No focal consolidation or pulmonary edema. Asymmetric and dependent pulmonary ground glass opacity more resembles atelectasis than respiratory infection. No pneumothorax. No suspicious pulmonary nodules. SOFT TISSUES AND BONES: Advanced thoracic spine degeneration with superimposed hyperostosis resulting in occasional thoracic vertebral ankylosis. Chronic rib fractures. No acute abnormality of the soft tissues. UPPER ABDOMEN: Non-contrast visible upper abdominal viscera appear stable since 2018, including chronic left upper pole renal cyst which appears simple and benign (no follow up imaging recommended). IMPRESSION: 1. No pulmonary embolism identified. 2. Generalized bronchial wall thickening and bilateral bronchial narrowing. No consolidation. Small bilateral layering pleural effusions. Consider acute or chronic Bronchitis, with atelectasis but no convincing pneumonia at this time. Electronically signed by: Helayne Hurst MD 04/18/2024 05:20 AM EST RP Workstation: [id number]   DG Chest 2 View Result Date: 04/17/2024 EXAM: 2 VIEW(S) XRAY OF THE CHEST 04/17/2024 07:01:00 AM COMPARISON: None available. CLINICAL HISTORY: SOB SOB FINDINGS:  LUNGS AND PLEURA: No focal pulmonary opacity. Pulmonary vascular congestion and mild edema. Trace pleural effusion is identified with thickening of the right minor fissure. No pneumothorax. HEART  AND MEDIASTINUM: Mild cardiac enlargement. BONES AND SOFT TISSUES: No acute osseous abnormality. IMPRESSION: 1. Pulmonary vascular congestion with mild interstitial pulmonary edema and trace pleural effusion. Correlate for signs or symptoms of congestive heart failure. Electronically signed by: Waddell Calk MD 04/17/2024 07:56 AM EST RP Workstation: [id number]    Microbiology: Results for orders placed or performed during the hospital encounter of 04/17/24  Resp panel by RT-PCR (RSV, Flu A&B, Covid) Anterior Nasal Swab     Status: None   Collection Time: 04/17/24  7:29 AM   Specimen: Anterior Nasal Swab  Result Value Ref Range Status   SARS Coronavirus 2 by RT PCR NEGATIVE NEGATIVE Final    Comment: (NOTE) SARS-CoV-2 target nucleic acids are NOT DETECTED.  The SARS-CoV-2 RNA is generally detectable in upper respiratory specimens during the acute phase of infection. The lowest concentration of SARS-CoV-2 viral copies this assay can detect is 138 copies/mL. A negative result does not preclude SARS-Cov-2 infection and should not be used as the sole basis for treatment or other patient management decisions. A negative result may occur with  improper specimen collection/handling, submission of specimen other than nasopharyngeal swab, presence of viral mutation(s) within the areas targeted by this assay, and inadequate number of viral copies(<138 copies/mL). A negative result must be combined with clinical observations, patient history, and epidemiological information. The expected result is Negative.  Fact Sheet for Patients:  bloggercourse.com  Fact Sheet for Healthcare Providers:  seriousbroker.it  This test is no t yet approved or cleared by the  [place]  FDA and  has been authorized for detection and/or diagnosis of SARS-CoV-2 by FDA under an Emergency Use Authorization (EUA). This EUA will remain  in effect (meaning this test can be used) for the duration of the COVID-19 declaration under Section 564(b)(1) of the Act, 21 U.S.C.section 360bbb-3(b)(1), unless the authorization is terminated  or revoked sooner.       Influenza A by PCR NEGATIVE NEGATIVE Final   Influenza B by PCR NEGATIVE NEGATIVE Final    Comment: (NOTE) The Xpert Xpress SARS-CoV-2/FLU/RSV plus assay is intended as an aid in the diagnosis of influenza from Nasopharyngeal swab specimens and should not be used as a sole basis for treatment. Nasal washings and aspirates are unacceptable for Xpert Xpress SARS-CoV-2/FLU/RSV testing.  Fact Sheet for Patients: bloggercourse.com  Fact Sheet for Healthcare Providers: seriousbroker.it  This test is not yet approved or cleared by the [place]  FDA and has been authorized for detection and/or diagnosis of SARS-CoV-2 by FDA under an Emergency Use Authorization (EUA). This EUA will remain in effect (meaning this test can be used) for the duration of the COVID-19 declaration under Section 564(b)(1) of the Act, 21 U.S.C. section 360bbb-3(b)(1), unless the authorization is terminated or revoked.     Resp Syncytial Virus by PCR NEGATIVE NEGATIVE Final    Comment: (NOTE) Fact Sheet for Patients: bloggercourse.com  Fact Sheet for Healthcare Providers: seriousbroker.it  This test is not yet approved or cleared by the [place]  FDA and has been authorized for detection and/or diagnosis of SARS-CoV-2 by FDA under an Emergency Use Authorization (EUA). This EUA will remain in effect (meaning this test can be used) for the duration of the COVID-19 declaration under Section 564(b)(1) of the Act, 21 U.S.C. section  360bbb-3(b)(1), unless the authorization is terminated or revoked.  Performed at [facility], [address], [place], [place] [address]    Labs: CBC: Recent Labs  Lab 04/18/24 [phone] 04/18/24 0336 04/19/24 0259 04/20/24  9675 04/20/24 1336 04/20/24 1341 04/20/24 1343 04/21/24 0246 04/22/24 0233 04/23/24 0254  WBC 10.4   < > 9.9 9.6  --   --   --  9.0 9.5 9.7  NEUTROABS 6.0  --   --   --   --   --   --  5.1  --   --   HGB 15.7   < > 16.0 15.2   < > 15.0  16.3 13.6 15.7 15.3 15.9  HCT 48.1   < > 49.9 47.0   < > 44.0  48.0 40.0 48.7 47.3 49.4  MCV 91.4   < > 91.2 91.3  --   --   --  91.0 90.4 90.6  PLT 243   < > 245 222  --   --   --  239 251 267   < > = values in this interval not displayed.   Basic Metabolic Panel: Recent Labs  Lab 04/18/24 0044 04/18/24 0336 04/19/24 0259 04/20/24 0324 04/20/24 1336 04/20/24 1343 04/21/24 0246 04/22/24 0233 04/23/24 0254 04/24/24 0250 04/24/24 0820  NA 136 137   < > 136   < > 145 140 138 137 137  --   K 4.2 4.0   < > 4.1   < > 2.9* 4.2 4.7 4.7 5.3* 4.9  CL 93* 94*   < > 93*  --   --  95* 93* 95* 96*  --   CO2 30 32   < > 32  --   --  32 33* 33* 31  --   GLUCOSE 205* 159*   < > 169*  --   --  184* 210* 173* 199*  --   BUN 12 11   < > 20  --   --  27* 29* 31* 27*  --   CREATININE 1.15 1.04   < > 1.48*  --   --  1.60* 1.57* 1.60* 1.41*  --   CALCIUM 9.7 9.5   < > 9.1  --   --  9.2 9.2 9.3 9.4  --   MG 1.7 1.8  --   --   --   --   --   --   --   --   --   PHOS 2.3* 2.5  --   --   --   --   --   --   --   --   --    < > = values in this interval not displayed.   Liver Function Tests: Recent Labs  Lab 04/18/24 0044 04/18/24 0336  AST 27 23  ALT 23 22  ALKPHOS 95 90  BILITOT 0.7 1.0  PROT 6.5 6.5  ALBUMIN 3.3* 3.2*   CBG: Recent Labs  Lab 04/23/24 1256 04/23/24 1558 04/23/24 2115 04/24/24 0557 04/24/24 1128  GLUCAP 125* 212* 139* 211* 158*    Discharge time spent: greater than 30  minutes.  Author: Yetta Blanch, MD  [facility] 04/24/2024

## 2024-04-24 NOTE — TOC Transition Note (Addendum)
 Transition of Care [facility]) - Discharge Note   Patient Details  Name: Charles Nunez MRN: [id number] Date of Birth: 1961/04/30  [facility] CM/SW Contact:  Justina Delcia Czar, RN Phone Number: [phone] 04/24/2024, 1:29 PM   Clinical Narrative:     Hospital follow up appt arranged for 05/02/2024 at 2:20 pm. Office requested dc summary, notified attending. Updated wife with appt information.  States pt has a scale at home for daily weights. Discussed low sodium/heart healthy diet, daily weights and adherence to medications. States she is going to speak to him about putting medications in pillbox.   Final next level of care: Home/Self Care Barriers to Discharge: No Barriers Identified   Patient Goals and CMS Choice            Discharge Placement                       Discharge Plan and Services Additional resources added to the After Visit Summary for      Social Drivers of Health (SDOH) Interventions SDOH Screenings   Food Insecurity: No Food Insecurity (04/17/2024)  Housing: Low Risk  (04/17/2024)  Transportation Needs: No Transportation Needs (04/17/2024)  Utilities: Not At Risk (04/17/2024)  Tobacco Use: High Risk (04/23/2024)     Readmission Risk Interventions     No data to display

## 2024-04-27 ENCOUNTER — Telehealth (HOSPITAL_COMMUNITY): Payer: Self-pay

## 2024-04-27 NOTE — Telephone Encounter (Signed)
 Called to confirm/remind patient of their appointment at the [facility] on 04/27/2024.   Appointment:   [] Confirmed  [x] Left mess   [] No answer/No voice mail  [] VM Full/unable to leave message  [] Phone not in service  Patient reminded to bring all medications and/or complete list.  Confirmed patient has transportation. Gave directions, instructed to utilize valet parking.

## 2024-04-30 ENCOUNTER — Inpatient Hospital Stay (HOSPITAL_BASED_OUTPATIENT_CLINIC_OR_DEPARTMENT_OTHER)
Admit: 2024-04-30 | Discharge: 2024-04-30 | Disposition: A | Discharge status: Home / Self Care | Attending: Adult Health | Admitting: Adult Health

## 2024-04-30 ENCOUNTER — Encounter (HOSPITAL_COMMUNITY): Payer: Self-pay

## 2024-04-30 VITALS — BP 130/72 | HR 97 | Ht 70.5 in | Wt 278.0 lb

## 2024-04-30 DIAGNOSIS — G4733 Obstructive sleep apnea (adult) (pediatric): Secondary | ICD-10-CM | POA: Diagnosis not present

## 2024-04-30 DIAGNOSIS — Z720 Tobacco use: Secondary | ICD-10-CM

## 2024-04-30 DIAGNOSIS — F1410 Cocaine abuse, uncomplicated: Secondary | ICD-10-CM

## 2024-04-30 DIAGNOSIS — I10 Essential (primary) hypertension: Secondary | ICD-10-CM | POA: Diagnosis not present

## 2024-04-30 DIAGNOSIS — I5022 Chronic systolic (congestive) heart failure: Secondary | ICD-10-CM

## 2024-04-30 NOTE — Patient Instructions (Signed)
 Medication Changes:  No Changes In Medications at this time.   Lab Work:  PLEASE HAVE LABS DRAWN (BASIC METABOLIC PANEL AT YOUR PRIMARY CARE APPOINTMENT AND HAVE THIS FAXED TO OUR OFFICE [phone]  Follow-Up in: 3 WEEKS AS SCHEDULED WITH APP CLINIC  At the [facility], you and your health needs are our priority. We have a designated team specialized in the treatment of Heart Failure. This Care Team includes your primary Heart Failure Specialized Cardiologist (physician), Advanced Practice Providers (APPs- Physician Assistants and Nurse Practitioners), and Pharmacist who all work together to provide you with the care you need, when you need it.   You may see any of the following providers on your designated Care Team at your next follow up:  Dr. Toribio Fuel Dr. Ezra Shuck Dr. Odis Brownie Greig Mosses, NP Caffie Shed, [place] [facility], [place] Beckey Coe, NP Jordan Lee, NP Tinnie Redman, PharmD   Please be sure to bring in all your medications bottles to every appointment.   Need to Contact Us :  If you have any questions or concerns before your next appointment please send us  a message through [facility] or call our office at [phone].    TO LEAVE A MESSAGE FOR THE NURSE SELECT OPTION 2, PLEASE LEAVE A MESSAGE INCLUDING: YOUR NAME DATE OF BIRTH CALL BACK NUMBER REASON FOR CALL**this is important as we prioritize the call backs  YOU WILL RECEIVE A CALL BACK THE SAME DAY AS LONG AS YOU CALL BEFORE 4:00 PM

## 2024-04-30 NOTE — Progress Notes (Addendum)
 ADVANCED HF CLINIC NOTE  Referring Physician: Health, [facility] Primary Care: Health, [facility] Primary Cardiologist: None  Chief Complaint: Heart Failure  HPI:  Charles Nunez is a 63 y.o. male with history of HTN, T2DM, OSA, tobacco use, social cocaine use, and chronic HFrEF. No prior HF history. No family history of heart failure that is he aware of. Is on disability for back issues.   Presented to [facility] 11/4/ 25 with shortness of breath and cough. On admission he was tachycardiac and hypertensive. ECG showed ST 106 bpm. CXR showed pulm edema with hilar lymphadenopathy. CT chest negative for PE, and with bronchial narrowing and small pleural effusions concerning for acute vs chronic bronchitis. Echo showed EF 20-25%, RWMAs, nl RV function, no Charles, collapsible IVC. Cath- no coronary disease. He was diuresed to IV Lasix and transitioned to 40 mg daily. He was unable to complete CMRI Started on GDMT.  Today he returns for HF follow up. Complaining of fatigue. Fall asleep easily.  Denies SOB/PND/Orthopnea. Appetite ok. Following low salt. No fever or chills. Taking all medications. Has not used cocaine. He has had one cigarette since his hospitalization. Disabled due to back pain.    Past Medical History:  Diagnosis Date   Back pain    Diabetes mellitus without complication (HCC)     Current Outpatient Medications  Medication Sig Dispense Refill   acetaminophen  (TYLENOL ) 650 MG CR tablet Take 650 mg by mouth every 8 (eight) hours as needed for pain.     atorvastatin (LIPITOR) 80 MG tablet Take 1 tablet (80 mg total) by mouth daily. 30 tablet 0   digoxin (LANOXIN) 0.125 MG tablet Take 1 tablet (0.125 mg total) by mouth daily. 30 tablet 0   DULoxetine (CYMBALTA) 30 MG capsule Take 30 mg by mouth daily.     empagliflozin (JARDIANCE) 10 MG TABS tablet Take 1 tablet (10 mg total) by mouth daily. 30 tablet 0   furosemide (LASIX) 40 MG tablet Take 1 tablet (40 mg total) by mouth daily. Take  additional 40 mg for weight gain of 3 lbs in 1 day or 5 lbs in 1 week 30 tablet 0   pregabalin (LYRICA) 50 MG capsule Take 1 capsule (50 mg total) by mouth 3 (three) times daily. 90 capsule 0   sacubitril-valsartan (ENTRESTO) 24-26 MG Take 1 tablet by mouth 2 (two) times daily. 60 tablet 0   citalopram (CELEXA) 20 MG tablet Take 20 mg by mouth daily. (Patient not taking: Reported on 04/30/2024)     glipiZIDE (GLUCOTROL XL) 10 MG 24 hr tablet Take 10 mg by mouth every morning. (Patient not taking: Reported on 04/30/2024)     HYDROcodone -acetaminophen  (NORCO/VICODIN) 5-325 MG tablet Take 1 tablet by mouth every 4 (four) hours as needed. (Patient not taking: Reported on 04/30/2024) 10 tablet 0   nicotine (NICODERM CQ - DOSED IN MG/24 HOURS) 14 mg/24hr patch Place 1 patch (14 mg total) onto the skin daily. (Patient not taking: Reported on 04/30/2024) 28 patch 0   No current facility-administered medications for this encounter.    Allergies  Allergen Reactions   Vancomycin Other (See Comments)    Other reaction(s): Red Man Syndrome (ALLERGY), Redness Red man syndrome Per patient.  Patient denies anaphylaxis as mentioned in OSH records       Social History   Socioeconomic History   Marital status: Married    Spouse name: Not on file   Number of children: Not on file   Years of education:  Not on file   Highest education level: Not on file  Occupational History   Not on file  Tobacco Use   Smoking status: Some Days    Current packs/day: 0.25    Average packs/day: 0.3 packs/day for 45.9 years (11.5 ttl pk-yrs)    Types: Cigarettes    Start date: 06/23/1978   Smokeless tobacco: Never  Substance and Sexual Activity   Alcohol use: No   Drug use: Not Currently   Sexual activity: Not on file  Other Topics Concern   Not on file  Social History Narrative   Not on file   Social Drivers of Health   Financial Resource Strain: Not on file  Food Insecurity: No Food Insecurity (04/17/2024)    Hunger Vital Sign    Worried About Running Out of Food in the Last Year: Never true    Ran Out of Food in the Last Year: Never true  Transportation Needs: No Transportation Needs (04/17/2024)   PRAPARE - Administrator, Civil Service (Medical): No    Lack of Transportation (Non-Medical): No  Physical Activity: Not on file  Stress: Not on file  Social Connections: Not on file  Intimate Partner Violence: Not At Risk (04/17/2024)   Humiliation, Afraid, Rape, and Kick questionnaire    Fear of Current or Ex-Partner: No    Emotionally Abused: No    Physically Abused: No    Sexually Abused: No      Family History  Problem Relation Age of Onset   Diabetes Mother    Diabetes Father    Alpha-1 antitrypsin deficiency Neg Hx    Lung cancer Neg Hx     Vitals:   04/30/24 1335  BP: 130/72  Pulse: 97  SpO2: 96%  Weight: 126.1 kg (278 lb)  Height: 5' 10.5 (1.791 m)   Wt Readings from Last 3 Encounters:  04/30/24 126.1 kg (278 lb)  04/24/24 125.1 kg (275 lb 12.7 oz)  01/20/24 127 kg (280 lb)     PHYSICAL EXAM: General:   No resp difficulty Neck: no JVD.  Cor: Regular rate & rhythm.  Lungs: clear Abdomen: soft, nontender, nondistended.  Extremities: no  edema Neuro: alert & oriented x3   ASSESSMENT & PLAN: 1. Chronic HFrEF, NICM  Recently diagnosed. Echo LVEF 20-25% . Cath with no CAD. Possible etiology HTN ? Viral.  Unable to obtain CMRI due to claustrophobia.  NYHA II. Appears euvolemic. Continue lasix 40 mg daily.  Continue digoxin 0.125 mg daily Continue entresto 24-26 twice a day  Continue jardiance 10 mg daily Next visit consider taking spiro. Spiro previously stopped due to hyperkalemia.  Check BMET  Plan to repeat ECHO in 3 months    2. HTN Stable. Continue current medications.   3. Severe OSA Untreated in 2023. Suspect he will need another sleep study.  Refer to Pulmonary.  4. Cocaine Use  Has not used since 04/16/24.  Discussed importance of  cessation.   5. Tobacco Abuse Discussed cessation.   Refer to HFSW. Needs to be connected with PCP for diabetes.   Follow up in 3 weeks with APP for med titration and will repeat ECHO .  Charles Ure NP-C  5:27 PM   [facility]

## 2024-05-21 ENCOUNTER — Telehealth (HOSPITAL_COMMUNITY): Payer: Self-pay

## 2024-05-21 NOTE — Telephone Encounter (Signed)
 Called to confirm/remind patient of their appointment at the [facility] on 05/22/24.   Appointment:   [] Confirmed  [x] Left mess   [] No answer/No voice mail  [] VM Full/unable to leave message  [] Phone not in service  And to bring in all medications and/or complete list.

## 2024-05-21 NOTE — Progress Notes (Signed)
 ADVANCED HF CLINIC NOTE  Referring Physician: Health, [facility] Primary Care: Health, [facility] Primary Cardiologist: None  Chief Complaint: Heart Failure  HPI:  [name] is a 63 y.o. male with history of HTN, T2DM, OSA, tobacco use, social cocaine use, and chronic HFrEF. No prior HF history. No family history of heart failure that is he aware of. Is on disability for back issues.   Presented to [facility] 11/4/ 25 with shortness of breath and cough. On admission he was tachycardiac and hypertensive. ECG showed ST 106 bpm. CXR showed pulm edema with hilar lymphadenopathy. CT chest negative for PE, and with bronchial narrowing and small pleural effusions concerning for acute vs chronic bronchitis. Echo showed EF 20-25%, RWMAs, nl RV function, no MR, collapsible IVC. Cath- no coronary disease. He was diuresed to IV Lasix  and transitioned to 40 mg daily. He was unable to complete CMRI Started on GDMT.  Today he returns for HF follow up. Complaining of fatigue. Fall asleep easily.  Denies SOB/PND/Orthopnea. Appetite ok. Following low salt. No fever or chills. Taking all medications. Has not used cocaine. He has had one cigarette since his hospitalization. Disabled due to back pain.    Past Medical History:  Diagnosis Date   Back pain    Diabetes mellitus without complication (HCC)     Current Outpatient Medications  Medication Sig Dispense Refill   acetaminophen  (TYLENOL ) 650 MG CR tablet Take 650 mg by mouth every 8 (eight) hours as needed for pain.     atorvastatin  (LIPITOR ) 80 MG tablet Take 1 tablet (80 mg total) by mouth daily. 30 tablet 0   citalopram (CELEXA) 20 MG tablet Take 20 mg by mouth daily. (Patient not taking: Reported on 04/30/2024)     digoxin  (LANOXIN ) 0.125 MG tablet Take 1 tablet (0.125 mg total) by mouth daily. 30 tablet 0   DULoxetine (CYMBALTA) 30 MG capsule Take 30 mg by mouth daily.     empagliflozin  (JARDIANCE ) 10 MG TABS tablet Take 1 tablet (10 mg total) by mouth  daily. 30 tablet 0   furosemide  (LASIX ) 40 MG tablet Take 1 tablet (40 mg total) by mouth daily. Take additional 40 mg for weight gain of 3 lbs in 1 day or 5 lbs in 1 week 30 tablet 0   glipiZIDE (GLUCOTROL XL) 10 MG 24 hr tablet Take 10 mg by mouth every morning. (Patient not taking: Reported on 04/30/2024)     HYDROcodone -acetaminophen  (NORCO/VICODIN) 5-325 MG tablet Take 1 tablet by mouth every 4 (four) hours as needed. (Patient not taking: Reported on 04/30/2024) 10 tablet 0   nicotine  (NICODERM CQ  - DOSED IN MG/24 HOURS) 14 mg/24hr patch Place 1 patch (14 mg total) onto the skin daily. (Patient not taking: Reported on 04/30/2024) 28 patch 0   pregabalin  (LYRICA ) 50 MG capsule Take 1 capsule (50 mg total) by mouth 3 (three) times daily. 90 capsule 0   sacubitril -valsartan  (ENTRESTO ) 24-26 MG Take 1 tablet by mouth 2 (two) times daily. 60 tablet 0   No current facility-administered medications for this visit.    Allergies  Allergen Reactions   Vancomycin Other (See Comments)    Other reaction(s): Red Man Syndrome (ALLERGY), Redness Red man syndrome Per patient.  Patient denies anaphylaxis as mentioned in OSH records       Social History   Socioeconomic History   Marital status: Married    Spouse name: Not on file   Number of children: Not on file   Years of education:  Not on file   Highest education level: Not on file  Occupational History   Not on file  Tobacco Use   Smoking status: Some Days    Current packs/day: 0.25    Average packs/day: 0.2 packs/day for 45.9 years (11.5 ttl pk-yrs)    Types: Cigarettes    Start date: 06/23/1978   Smokeless tobacco: Never  Substance and Sexual Activity   Alcohol use: No   Drug use: Not Currently   Sexual activity: Not on file  Other Topics Concern   Not on file  Social History Narrative   Not on file   Social Drivers of Health   Financial Resource Strain: Not on file  Food Insecurity: No Food Insecurity (04/17/2024)   Hunger  Vital Sign    Worried About Running Out of Food in the Last Year: Never true    Ran Out of Food in the Last Year: Never true  Transportation Needs: No Transportation Needs (04/17/2024)   PRAPARE - Administrator, Civil Service (Medical): No    Lack of Transportation (Non-Medical): No  Physical Activity: Not on file  Stress: Not on file  Social Connections: Not on file  Intimate Partner Violence: Not At Risk (04/17/2024)   Humiliation, Afraid, Rape, and Kick questionnaire    Fear of Current or Ex-Partner: No    Emotionally Abused: No    Physically Abused: No    Sexually Abused: No      Family History  Problem Relation Age of Onset   Diabetes Mother    Diabetes Father    Alpha-1 antitrypsin deficiency Neg Hx    Lung cancer Neg Hx     There were no vitals filed for this visit.  Wt Readings from Last 3 Encounters:  04/30/24 126.1 kg (278 lb)  04/24/24 125.1 kg (275 lb 12.7 oz)  01/20/24 127 kg (280 lb)     PHYSICAL EXAM: General:   No resp difficulty Neck: no JVD.  Cor: Regular rate & rhythm.  Lungs: clear Abdomen: soft, nontender, nondistended.  Extremities: no  edema Neuro: alert & oriented x3   ASSESSMENT & PLAN: 1. Chronic HFrEF, NICM  Recently diagnosed. Echo LVEF 20-25% . Cath with no CAD. Possible etiology HTN ? Viral.  Unable to obtain CMRI due to claustrophobia.  NYHA II. Appears euvolemic. Continue lasix  40 mg daily.  Continue digoxin  0.125 mg daily Continue entresto  24-26 twice a day  Continue jardiance  10 mg daily Next visit consider taking spiro. Spiro previously stopped due to hyperkalemia.  Check BMET  Plan to repeat ECHO in 3 months    2. HTN Stable. Continue current medications.   3. Severe OSA Untreated in 2023. Suspect he will need another sleep study.  Refer to Pulmonary.  4. Cocaine Use  Has not used since 04/16/24.  Discussed importance of cessation.   5. Tobacco Abuse Discussed cessation.   Refer to HFSW. Needs to be  connected with PCP for diabetes.   Follow up in 3 weeks with APP for med titration and will repeat ECHO .  Harlene HERO Kimblery Diop NP-C  3:10 PM   [facility]

## 2024-05-22 ENCOUNTER — Ambulatory Visit (HOSPITAL_COMMUNITY)
Admission: RE | Admit: 2024-05-22 | Discharge: 2024-05-22 | Disposition: A | Source: Ambulatory Visit | Attending: Family Medicine

## 2024-05-22 ENCOUNTER — Encounter (HOSPITAL_COMMUNITY): Payer: Self-pay

## 2024-05-22 VITALS — BP 120/88 | HR 106 | Ht 70.5 in | Wt 271.4 lb

## 2024-05-22 DIAGNOSIS — F1410 Cocaine abuse, uncomplicated: Secondary | ICD-10-CM

## 2024-05-22 DIAGNOSIS — I5022 Chronic systolic (congestive) heart failure: Secondary | ICD-10-CM

## 2024-05-22 DIAGNOSIS — I10 Essential (primary) hypertension: Secondary | ICD-10-CM

## 2024-05-22 DIAGNOSIS — G4733 Obstructive sleep apnea (adult) (pediatric): Secondary | ICD-10-CM

## 2024-05-22 DIAGNOSIS — Z720 Tobacco use: Secondary | ICD-10-CM

## 2024-05-22 LAB — BASIC METABOLIC PANEL WITH GFR
Anion gap: 11 (ref 5–15)
BUN: 20 mg/dL (ref 8–23)
CO2: 28 mmol/L (ref 22–32)
Calcium: 10.1 mg/dL (ref 8.9–10.3)
Chloride: 100 mmol/L (ref 98–111)
Creatinine, Ser: 1.29 mg/dL — ABNORMAL HIGH (ref 0.61–1.24)
GFR, Estimated: >60 mL/min (ref >60)
Glucose, Bld: 124 mg/dL — ABNORMAL HIGH (ref 70–99)
Potassium: 4.6 mmol/L (ref 3.5–5.1)
Sodium: 139 mmol/L (ref 135–145)

## 2024-05-22 LAB — DIGOXIN LEVEL: Digoxin Level: <0.6 ng/mL — ABNORMAL LOW (ref 0.8–2.0)

## 2024-05-22 LAB — BRAIN NATRIURETIC PEPTIDE: B Natriuretic Peptide: 58 pg/mL (ref 0.0–100.0)

## 2024-05-22 MED ORDER — FUROSEMIDE 20 MG PO TABS: 20.0000 mg | ORAL_TABLET | Freq: Every day | ORAL | 5 refills | Status: DC

## 2024-05-22 MED ORDER — METOPROLOL SUCCINATE ER 25 MG PO TB24: 25.0000 mg | ORAL_TABLET | Freq: Every day | ORAL | 3 refills | Status: DC

## 2024-05-22 MED ORDER — SPIRONOLACTONE 25 MG PO TABS: 25.0000 mg | ORAL_TABLET | Freq: Every day | ORAL | 5 refills | Status: DC

## 2024-05-22 NOTE — Patient Instructions (Signed)
 Medication Changes:  START METOPROLOL  SUCCINATE 25MG  ONCE DAILY   START SPIRONOLACTONE  25MG  ONCE DAILY   CHANGE FUROSEMIDE  TO 20MG  ONCE DAILY   Lab Work:  Labs done today, your results will be available in MyChart, we will contact you for abnormal readings.  THEN RETURN FOR LABS AGAIN IN 1 WEEK AS SCHEDULED   Referrals:  YOU HAVE BEEN REFERRED TO PULMONOLOGY THEY WILL REACH OUT TO YOU OR CALL TO ARRANGE THIS. PLEASE CALL US  WITH ANY CONCERNS   Follow-Up in: IN 3 MONTHS WITH AN ECHOCARDIOGRAM WITH DR. CHERRIE PLEASE CALL OUR OFFICE AROUND JANUARY TO GET SCHEDULED FOR YOUR APPOINTMENT. PHONE NUMBER IS [phone] OPTION 2   At the [facility], you and your health needs are our priority. We have a designated team specialized in the treatment of Heart Failure. This Care Team includes your primary Heart Failure Specialized Cardiologist (physician), Advanced Practice Providers (APPs- Physician Assistants and Nurse Practitioners), and Pharmacist who all work together to provide you with the care you need, when you need it.   You may see any of the following providers on your designated Care Team at your next follow up:  Dr. Toribio Cherrie Dr. Ezra Shuck Dr. Odis Brownie Greig Mosses, NP Caffie Shed, [place] [facility] [place], [place] Beckey Coe, NP Jordan Lee, NP Tinnie Redman, PharmD   Please be sure to bring in all your medications bottles to every appointment.   Need to Contact Us :  If you have any questions or concerns before your next appointment please send us  a message through [place] or call our office at [phone].    TO LEAVE A MESSAGE FOR THE NURSE SELECT OPTION 2, PLEASE LEAVE A MESSAGE INCLUDING: YOUR NAME DATE OF BIRTH CALL BACK NUMBER REASON FOR CALL**this is important as we prioritize the call backs  YOU WILL RECEIVE A CALL BACK THE SAME DAY AS LONG AS YOU CALL BEFORE 4:00 PM

## 2024-05-22 NOTE — Addendum Note (Signed)
 Encounter addended by: Lamae Fosco B, RN on: 05/22/2024 4:02 PM  Actions taken: Order list changed, Diagnosis association updated

## 2024-05-23 ENCOUNTER — Ambulatory Visit (HOSPITAL_COMMUNITY): Payer: Self-pay | Admitting: Family Medicine

## 2024-05-23 DIAGNOSIS — I5022 Chronic systolic (congestive) heart failure: Secondary | ICD-10-CM

## 2024-05-29 ENCOUNTER — Ambulatory Visit (HOSPITAL_COMMUNITY)

## 2024-06-04 ENCOUNTER — Ambulatory Visit (HOSPITAL_COMMUNITY)

## 2024-06-11 ENCOUNTER — Ambulatory Visit (HOSPITAL_COMMUNITY)

## 2024-06-13 ENCOUNTER — Ambulatory Visit

## 2024-06-13 ENCOUNTER — Emergency Department (HOSPITAL_BASED_OUTPATIENT_CLINIC_OR_DEPARTMENT_OTHER): Admitting: Radiology

## 2024-06-13 ENCOUNTER — Other Ambulatory Visit: Payer: Self-pay

## 2024-06-13 ENCOUNTER — Emergency Department (HOSPITAL_BASED_OUTPATIENT_CLINIC_OR_DEPARTMENT_OTHER)
Admission: EM | Admit: 2024-06-13 | Discharge: 2024-06-14 | Disposition: A | Discharge status: Home / Self Care | Payer: Self-pay | Attending: Emergency Medicine | Admitting: Emergency Medicine

## 2024-06-13 DIAGNOSIS — I509 Heart failure, unspecified: Secondary | ICD-10-CM | POA: Insufficient documentation

## 2024-06-13 DIAGNOSIS — R059 Cough, unspecified: Secondary | ICD-10-CM | POA: Diagnosis not present

## 2024-06-13 DIAGNOSIS — I110 Hypertensive heart disease with heart failure: Secondary | ICD-10-CM | POA: Insufficient documentation

## 2024-06-13 DIAGNOSIS — R778 Other specified abnormalities of plasma proteins: Secondary | ICD-10-CM | POA: Diagnosis not present

## 2024-06-13 DIAGNOSIS — R062 Wheezing: Secondary | ICD-10-CM | POA: Insufficient documentation

## 2024-06-13 DIAGNOSIS — R0602 Shortness of breath: Secondary | ICD-10-CM | POA: Diagnosis present

## 2024-06-13 DIAGNOSIS — Z79899 Other long term (current) drug therapy: Secondary | ICD-10-CM | POA: Insufficient documentation

## 2024-06-13 DIAGNOSIS — E119 Type 2 diabetes mellitus without complications: Secondary | ICD-10-CM | POA: Diagnosis not present

## 2024-06-13 DIAGNOSIS — Z7984 Long term (current) use of oral hypoglycemic drugs: Secondary | ICD-10-CM | POA: Insufficient documentation

## 2024-06-13 LAB — BASIC METABOLIC PANEL WITH GFR
Anion gap: 9 (ref 5–15)
BUN: 15 mg/dL (ref 8–23)
CO2: 26 mmol/L (ref 22–32)
Calcium: 9.7 mg/dL (ref 8.9–10.3)
Chloride: 102 mmol/L (ref 98–111)
Creatinine, Ser: 1.29 mg/dL — ABNORMAL HIGH (ref 0.61–1.24)
GFR, Estimated: >60 mL/min
Glucose, Bld: 177 mg/dL — ABNORMAL HIGH (ref 70–99)
Potassium: 4.5 mmol/L (ref 3.5–5.1)
Sodium: 137 mmol/L (ref 135–145)

## 2024-06-13 LAB — TROPONIN T, HIGH SENSITIVITY: Troponin T High Sensitivity: 21 ng/L — ABNORMAL HIGH (ref 0–19)

## 2024-06-13 LAB — CBC
HCT: 44.7 % (ref 39.0–52.0)
Hemoglobin: 14.5 g/dL (ref 13.0–17.0)
MCH: 29.4 pg (ref 26.0–34.0)
MCHC: 32.4 g/dL (ref 30.0–36.0)
MCV: 90.5 fL (ref 80.0–100.0)
Platelets: 172 K/uL (ref 150–400)
RBC: 4.94 MIL/uL (ref 4.22–5.81)
RDW: 12.7 % (ref 11.5–15.5)
WBC: 8.1 K/uL (ref 4.0–10.5)
nRBC: 0 % (ref 0.0–0.2)

## 2024-06-13 LAB — PRO BRAIN NATRIURETIC PEPTIDE: Pro Brain Natriuretic Peptide: 421 pg/mL — ABNORMAL HIGH

## 2024-06-13 NOTE — Progress Notes (Deleted)
 Patient ID: Charles Nunez                 DOB: May 06, 1961                    MRN: [id number]      HPI: Charles Nunez is a 63 y.o. male patient referred to lipid clinic by Dr. Tobie. PMH is significant for HFrEF, HTN, T2DM, severe OSA, social cocaine use and tobacco use.  Patient visited ER in November 2025 for  sob, hypercholesterolemia not on meds, started on atorvastatin  80 mg been on for 1 month and hospital provider referred to pharmD lipid clinic and Outpatient follow-up with cardiology recommended    Reviewed options for lowering LDL cholesterol, including ezetimibe, PCSK-9 inhibitors, bempedoic acid and inclisiran.  Discussed mechanisms of action, dosing, side effects and potential decreases in LDL cholesterol.  Also reviewed cost information and potential options for patient assistance.   Current Medications: atorvastatin  80 mg daily Intolerances: none Risk Factors: HFrEF, HTN, T2DM, tobacco use, elevated Lpa LDL goal: < 70 Lipid panel (04/2024): Chol 259, Trig 192, HDL 46, LDL 175 Lpa (04/2024): 136 Liver enzymes (04/2024): AST 23, ALT 22, Alk phos 90  Diet:  Breakfast: Lunch/Dinner: Snacks: Beverages:  Exercise:   Family History:  Relation Problem Comments  Mother (Other) Diabetes     Father (Other) Diabetes     Neg Hx Alpha-1 antitrypsin deficiency   Lung cancer       Social History:  Alcohol: Smoking:  Labs:  Lipid Panel     Component Value Date/Time   CHOL 259 (H) 04/21/2024 0246   TRIG 192 (H) 04/21/2024 0246   HDL 46 04/21/2024 0246   CHOLHDL 5.6 04/21/2024 0246   VLDL 38 04/21/2024 0246   LDLCALC 175 (H) 04/21/2024 0246    Past Medical History:  Diagnosis Date   Back pain    Diabetes mellitus without complication (HCC)     Medications Ordered Prior to Encounter[1]  Allergies[2]  Assessment/Plan:  1. Hyperlipidemia -  No problems updated. No problem-specific Assessment & Plan notes found for this encounter.    Thank  you,  Rion Catala E. Paulo Keimig, Pharm.D, CPP [facility]. [facility] [address], [place], [place] [address] Phone: [phone]; Fax: [phone]        [1]  Current Outpatient Medications on File Prior to Visit  Medication Sig Dispense Refill   acetaminophen  (TYLENOL ) 650 MG CR tablet Take 650 mg by mouth every 8 (eight) hours as needed for pain.     atorvastatin  (LIPITOR ) 80 MG tablet Take 1 tablet (80 mg total) by mouth daily. 30 tablet 0   citalopram (CELEXA) 20 MG tablet Take 20 mg by mouth daily.     digoxin  (LANOXIN ) 0.125 MG tablet Take 1 tablet (0.125 mg total) by mouth daily. 30 tablet 0   DULoxetine (CYMBALTA) 30 MG capsule Take 30 mg by mouth daily.     empagliflozin  (JARDIANCE ) 10 MG TABS tablet Take 1 tablet (10 mg total) by mouth daily. 30 tablet 0   furosemide  (LASIX ) 20 MG tablet Take 1 tablet (20 mg total) by mouth daily. 30 tablet 5   glipiZIDE (GLUCOTROL XL) 10 MG 24 hr tablet Take 10 mg by mouth every morning.     HYDROcodone -acetaminophen  (NORCO/VICODIN) 5-325 MG tablet Take 1 tablet by mouth every 4 (four) hours as needed. 10 tablet 0   metoprolol  succinate (TOPROL -XL) 25 MG 24 hr tablet Take  1 tablet (25 mg total) by mouth daily. Take with or immediately following a meal. 90 tablet 3   MOUNJARO 2.5 MG/0.5ML Pen INJECT 2.5 MG BY SUBCUTANEOUS ROUTE ONCE A WEEK FOR 4 WEEKS THEN INCREASE TO 5 MG WEEKLY.     nicotine  (NICODERM CQ  - DOSED IN MG/24 HOURS) 14 mg/24hr patch Place 1 patch (14 mg total) onto the skin daily. 28 patch 0   pregabalin  (LYRICA ) 50 MG capsule Take 1 capsule (50 mg total) by mouth 3 (three) times daily. 90 capsule 0   sacubitril -valsartan  (ENTRESTO ) 24-26 MG Take 1 tablet by mouth 2 (two) times daily. 60 tablet 0   spironolactone  (ALDACTONE ) 25 MG tablet Take 1 tablet (25 mg total) by mouth daily. 30 tablet 5   No current facility-administered medications on file prior to visit.  [2]  Allergies Allergen  Reactions   Vancomycin Other (See Comments)    Other reaction(s): Red Man Syndrome (ALLERGY), Redness Red man syndrome Per patient.  Patient denies anaphylaxis as mentioned in OSH records

## 2024-06-13 NOTE — ED Triage Notes (Addendum)
 Grand kids have RSV. Pt reports feeling sick x1 week. Congestion, coughing, chills, ha. Denies CP, exp wheezing, RT at triage for eval. HF pt on lasix - no crackles, denies swelling.

## 2024-06-14 NOTE — Discharge Instructions (Signed)
 Increase your Lasix  to 40 mg daily for the next week.  Follow-up with your primary doctor or cardiologist in the next week, and return to the ER if you develop chest pain, worsening breathing, or for other new and concerning symptoms.

## 2024-06-14 NOTE — ED Provider Notes (Signed)
 " [place] EMERGENCY DEPARTMENT AT [facility] Provider Note   CSN: [id number] Arrival date & time: 06/13/24  2145     Patient presents with: Cough   Charles Nunez is a 64 y.o. male.   Patient is a 64 year old male with past medical history of CHF, hypertension, struct of sleep apnea, type 2 diabetes.  Patient presenting today with complaints of shortness of breath.  He describes some cough, wheezing, and feeling short of breath over the past few days.  Symptoms are worse when he attempts to lie flat and lay down at night to sleep.  He denies any chest pain or leg swelling.       Prior to Admission medications  Medication Sig Start Date End Date Taking? Authorizing Provider  acetaminophen  (TYLENOL ) 650 MG CR tablet Take 650 mg by mouth every 8 (eight) hours as needed for pain.    [provider]  atorvastatin  (LIPITOR ) 80 MG tablet Take 1 tablet (80 mg total) by mouth daily. 04/25/24   Patel, Pranav M, MD  citalopram (CELEXA) 20 MG tablet Take 20 mg by mouth daily. 05/28/21   [provider]  digoxin  (LANOXIN ) 0.125 MG tablet Take 1 tablet (0.125 mg total) by mouth daily. 04/25/24   Tobie Yetta HERO, MD  DULoxetine (CYMBALTA) 30 MG capsule Take 30 mg by mouth daily.    [provider]  empagliflozin  (JARDIANCE ) 10 MG TABS tablet Take 1 tablet (10 mg total) by mouth daily. 04/25/24   Tobie Yetta HERO, MD  furosemide  (LASIX ) 20 MG tablet Take 1 tablet (20 mg total) by mouth daily. 05/22/24   Milford, Harlene HERO, FNP  glipiZIDE (GLUCOTROL XL) 10 MG 24 hr tablet Take 10 mg by mouth every morning. 06/24/21   [provider]  HYDROcodone -acetaminophen  (NORCO/VICODIN) 5-325 MG tablet Take 1 tablet by mouth every 4 (four) hours as needed. 01/20/24   Freddi Hamilton, MD  metoprolol  succinate (TOPROL -XL) 25 MG 24 hr tablet Take 1 tablet (25 mg total) by mouth daily. Take with or immediately following a meal. 05/22/24   Milford, Harlene HERO, FNP  MOUNJARO 2.5  MG/0.5ML Pen INJECT 2.5 MG BY SUBCUTANEOUS ROUTE ONCE A WEEK FOR 4 WEEKS THEN INCREASE TO 5 MG WEEKLY. 05/03/24   [provider]  nicotine  (NICODERM CQ  - DOSED IN MG/24 HOURS) 14 mg/24hr patch Place 1 patch (14 mg total) onto the skin daily. 04/25/24   Tobie Yetta HERO, MD  pregabalin  (LYRICA ) 50 MG capsule Take 1 capsule (50 mg total) by mouth 3 (three) times daily. 04/24/24   Tobie Yetta HERO, MD  sacubitril -valsartan  (ENTRESTO ) 24-26 MG Take 1 tablet by mouth 2 (two) times daily. 04/24/24   Tobie Yetta HERO, MD  spironolactone  (ALDACTONE ) 25 MG tablet Take 1 tablet (25 mg total) by mouth daily. 05/22/24   Glena Harlene HERO, FNP    Allergies: Vancomycin    Review of Systems  All other systems reviewed and are negative.   Updated Vital Signs BP 127/84   Pulse 77   Temp 97.8 F (36.6 C)   Resp 19   SpO2 92%   Physical Exam Vitals and nursing note reviewed.  Constitutional:      General: He is not in acute distress.    Appearance: He is well-developed. He is not diaphoretic.  HENT:     Head: Normocephalic and atraumatic.  Cardiovascular:     Rate and Rhythm: Normal rate and regular rhythm.     Heart sounds: No murmur heard.  No friction rub.  Pulmonary:     Effort: Pulmonary effort is normal. No respiratory distress.     Breath sounds: Normal breath sounds. No wheezing or rales.  Abdominal:     General: Bowel sounds are normal. There is no distension.     Palpations: Abdomen is soft.     Tenderness: There is no abdominal tenderness.  Musculoskeletal:        General: Normal range of motion.     Cervical back: Normal range of motion and neck supple.  Skin:    General: Skin is warm and dry.  Neurological:     Mental Status: He is alert and oriented to person, place, and time.     Coordination: Coordination normal.     (all labs ordered are listed, but only abnormal results are displayed) Labs Reviewed  BASIC METABOLIC PANEL WITH GFR - Abnormal; Notable for the  following components:      Result Value   Glucose, Bld 177 (*)    Creatinine, Ser 1.29 (*)    All other components within normal limits  PRO BRAIN NATRIURETIC PEPTIDE - Abnormal; Notable for the following components:   Pro Brain Natriuretic Peptide 421.0 (*)    All other components within normal limits  TROPONIN T, HIGH SENSITIVITY - Abnormal; Notable for the following components:   Troponin T High Sensitivity 21 (*)    All other components within normal limits  CBC    EKG: None  Radiology: DG Chest 2 View Result Date: 06/13/2024 EXAM: 2 VIEW(S) XRAY OF THE CHEST 06/13/2024 10:09:00 PM COMPARISON: 04/17/2024 CLINICAL HISTORY: Shortness of breath. FINDINGS: LUNGS AND PLEURA: Right hilar prominence. Mild pulmonary edema. Mildly increased interstitial markings. No focal pulmonary opacity. No pleural effusion. No pneumothorax. HEART AND MEDIASTINUM: Cardiomegaly unchanged. BONES AND SOFT TISSUES: No acute osseous abnormality. IMPRESSION: 1. Mild pulmonary edema with mildly increased interstitial markings. 2. Unchanged cardiomegaly with right hilar prominence. Electronically signed by: Greig Pique MD 06/13/2024 10:59 PM EST RP Workstation: [id number]     Procedures   Medications Ordered in the ED - No data to display                                  Medical Decision Making Amount and/or Complexity of Data Reviewed Labs: ordered. Radiology: ordered.   Patient is a 64 year old male presenting with shortness of breath.  He has history of recently diagnosed CHF and follows with cardiology.  Patient arrives here with stable vital signs and is afebrile.  Physical examination basically unremarkable.  Laboratory studies obtained including CBC, basic metabolic panel, and BNP.  Troponin minimally elevated at 21.  Chest x-ray showing mild pulmonary edema with mildly increased interstitial markings.  He also has cardiomegaly.  Suspect symptoms related to fluid retention.  He reports being  compliant with his Lasix , but does admit to some dietary indiscretion over the holidays.  I offered to give IV Lasix , however patient is anxious to go home.  I will recommend he increase his dose from 20 to 40 mg for the next week and follow-up with his cardiologist next week.  Patient to return as needed.     Final diagnoses:  None    ED Discharge Orders     None          Geroldine Berg, MD 06/14/24 [phone]  "

## 2024-06-18 ENCOUNTER — Other Ambulatory Visit (HOSPITAL_COMMUNITY): Payer: Self-pay

## 2024-06-18 MED ORDER — METOPROLOL SUCCINATE ER 25 MG PO TB24: 25.0000 mg | ORAL_TABLET | Freq: Every day | ORAL | 3 refills | Status: AC

## 2024-06-18 MED ORDER — DIGOXIN 125 MCG PO TABS: 0.1250 mg | ORAL_TABLET | Freq: Every day | ORAL | 3 refills | Status: DC

## 2024-06-18 MED ORDER — SACUBITRIL-VALSARTAN 24-26 MG PO TABS: 1.0000 | ORAL_TABLET | Freq: Two times a day (BID) | ORAL | 11 refills | Status: AC

## 2024-06-18 MED ORDER — FUROSEMIDE 20 MG PO TABS: 20.0000 mg | ORAL_TABLET | Freq: Every day | ORAL | 3 refills | Status: AC

## 2024-06-18 MED ORDER — SPIRONOLACTONE 25 MG PO TABS: 25.0000 mg | ORAL_TABLET | Freq: Every day | ORAL | 3 refills | Status: DC

## 2024-06-18 MED ORDER — EMPAGLIFLOZIN 10 MG PO TABS: 10.0000 mg | ORAL_TABLET | Freq: Every day | ORAL | 11 refills | Status: AC

## 2024-06-25 ENCOUNTER — Ambulatory Visit (HOSPITAL_COMMUNITY)
Admission: RE | Admit: 2024-06-25 | Discharge: 2024-06-25 | Disposition: A | Source: Ambulatory Visit | Attending: Cardiology

## 2024-06-25 DIAGNOSIS — I5022 Chronic systolic (congestive) heart failure: Secondary | ICD-10-CM | POA: Diagnosis present

## 2024-06-25 LAB — BASIC METABOLIC PANEL WITH GFR
Anion gap: 6 (ref 5–15)
BUN: 17 mg/dL (ref 8–23)
CO2: 37 mmol/L — ABNORMAL HIGH (ref 22–32)
Calcium: 10.8 mg/dL — ABNORMAL HIGH (ref 8.9–10.3)
Chloride: 99 mmol/L (ref 98–111)
Creatinine, Ser: 1.12 mg/dL (ref 0.61–1.24)
GFR, Estimated: >60 mL/min
Glucose, Bld: 142 mg/dL — ABNORMAL HIGH (ref 70–99)
Potassium: 5.4 mmol/L — ABNORMAL HIGH (ref 3.5–5.1)
Sodium: 141 mmol/L (ref 135–145)

## 2024-06-29 NOTE — Telephone Encounter (Signed)
-----   Message from Harlene CHRISTELLA Gainer sent at 06/25/2024  4:49 PM EST ----- K elevated. Hold spiro x 3 days, then reduce dose to 12.5 mg daily. Repeat BMET in 1 week

## 2024-06-29 NOTE — Addendum Note (Signed)
 Addended by: ALVAN COPA R on: 06/29/2024 03:27 PM   Modules accepted: Orders

## 2024-07-06 ENCOUNTER — Ambulatory Visit (HOSPITAL_COMMUNITY): Payer: Self-pay | Admitting: Family Medicine

## 2024-07-06 ENCOUNTER — Telehealth (HOSPITAL_COMMUNITY): Payer: Self-pay

## 2024-07-06 ENCOUNTER — Other Ambulatory Visit (HOSPITAL_COMMUNITY): Payer: Self-pay

## 2024-07-06 ENCOUNTER — Ambulatory Visit (HOSPITAL_COMMUNITY)
Admission: RE | Admit: 2024-07-06 | Discharge: 2024-07-06 | Disposition: A | Source: Ambulatory Visit | Attending: Cardiology

## 2024-07-06 DIAGNOSIS — I5022 Chronic systolic (congestive) heart failure: Secondary | ICD-10-CM | POA: Diagnosis present

## 2024-07-06 DIAGNOSIS — E875 Hyperkalemia: Secondary | ICD-10-CM

## 2024-07-06 LAB — BASIC METABOLIC PANEL WITH GFR
Anion gap: 6 (ref 5–15)
BUN: 15 mg/dL (ref 8–23)
CO2: 34 mmol/L — ABNORMAL HIGH (ref 22–32)
Calcium: 10.1 mg/dL (ref 8.9–10.3)
Chloride: 100 mmol/L (ref 98–111)
Creatinine, Ser: 1.16 mg/dL (ref 0.61–1.24)
GFR, Estimated: >60 mL/min
Glucose, Bld: 114 mg/dL — ABNORMAL HIGH (ref 70–99)
Potassium: 5.8 mmol/L — ABNORMAL HIGH (ref 3.5–5.1)
Sodium: 139 mmol/L (ref 135–145)

## 2024-07-06 MED ORDER — LOKELMA 10 G PO PACK: 10.0000 g | PACK | Freq: Once | ORAL | 0 refills | Status: AC

## 2024-07-06 NOTE — Telephone Encounter (Signed)
-----   Message from Harlene CHRISTELLA Gainer sent at 07/06/2024  4:34 PM EST ----- K remains elevated. Stop spiro. Needs Lokelma  10 g x 1 dose today. Repeat BMET next week

## 2024-07-06 NOTE — Telephone Encounter (Signed)
 Advanced Heart Failure Patient Advocate Encounter  Test billing for this patient's current coverage ([employer] M[employer]) returns a $11.85 copay for 1 day supply of Lokelma , or $12.65 for 30 days, with a limit 30 day refill.  This test claim was processed through [facility]- copay amounts may vary at other pharmacies due to pharmacy/plan contracts, or as the patient moves through the different stages of their insurance plan.  Rachel DEL, CPhT Rx Patient Advocate Phone: [phone]

## 2024-07-12 ENCOUNTER — Ambulatory Visit (HOSPITAL_COMMUNITY)

## 2024-07-12 ENCOUNTER — Ambulatory Visit (HOSPITAL_COMMUNITY): Admit: 2024-07-12

## 2024-07-12 ENCOUNTER — Encounter (HOSPITAL_COMMUNITY): Payer: Self-pay

## 2024-07-12 SURGERY — MRI WITH ANESTHESIA
Anesthesia: General

## 2024-07-13 ENCOUNTER — Ambulatory Visit (HOSPITAL_COMMUNITY)
Admission: RE | Admit: 2024-07-13 | Discharge: 2024-07-13 | Disposition: A | Source: Ambulatory Visit | Attending: Internal Medicine

## 2024-07-13 DIAGNOSIS — I5022 Chronic systolic (congestive) heart failure: Secondary | ICD-10-CM | POA: Diagnosis present

## 2024-07-13 DIAGNOSIS — E875 Hyperkalemia: Secondary | ICD-10-CM | POA: Diagnosis not present

## 2024-07-13 LAB — BASIC METABOLIC PANEL WITH GFR
Anion gap: 8 (ref 5–15)
BUN: 16 mg/dL (ref 8–23)
CO2: 35 mmol/L — ABNORMAL HIGH (ref 22–32)
Calcium: 10.3 mg/dL (ref 8.9–10.3)
Chloride: 98 mmol/L (ref 98–111)
Creatinine, Ser: 1.13 mg/dL (ref 0.61–1.24)
GFR, Estimated: >60 mL/min
Glucose, Bld: 150 mg/dL — ABNORMAL HIGH (ref 70–99)
Potassium: 5.2 mmol/L — ABNORMAL HIGH (ref 3.5–5.1)
Sodium: 141 mmol/L (ref 135–145)

## 2024-07-16 ENCOUNTER — Ambulatory Visit (HOSPITAL_COMMUNITY): Payer: Self-pay | Admitting: Family Medicine

## 2024-07-16 NOTE — Telephone Encounter (Addendum)
 Pt aware, agreeable, and verbalized understanding   ----- Message from Harlene CHRISTELLA Gainer sent at 07/16/2024 10:31 AM EST ----- K improved. Stay off spironolactone  and stick to low potassium diet. Will repeat labs at follow up

## 2024-07-17 ENCOUNTER — Telehealth (HOSPITAL_COMMUNITY): Payer: Self-pay

## 2024-07-17 NOTE — Progress Notes (Signed)
 "  ADVANCED HF CLINIC NOTE  Primary Care: [facility] Cardiologist: Dr. Cherrie  HPI: Charles Nunez is a 64 y.o. male with history of HTN, T2DM, OSA, tobacco use, social cocaine use, and newly diagnosed HFrEF.   No prior HF history. No family history of heart failure that is he aware of. Is on disability for back issues.   Admitted 11/25 with new acute systolic heart failure. Echo showed EF 20-25%, RWMAs, nl RV function, no Charles. R/LHC showed no CAD, well-compensated filling pressures and moderate to severely depressed CI due to biventricular failure. He was diuresed and GDMT titrated. He was unable to complete CMRI. He was discharged home, weight 275 lbs.  Today he returns for HF follow up with his wife. Overall feeling fine. No SOB with activity. Denies palpitations, abnormal bleeding, CP, dizziness, edema, or PND/Orthopnea. Appetite ok. Weight at home 270 pounds. Taking all medications. No ETOH, last used cocaine a week ago, smoking cigarettes off and on.  Echo today 07/18/24 showed EF 45-50%, images reviewed with Dr. Zenaida  Cardiac Studies - R/LHC (11/25): Left-dominant coronary system with no CAD; RA 7, PA 40/28 (32), PCWP 17, CO/CI (Fick) 4.5/1.9, PVR 3.0 WU, PAPi 1.6  - Echo (11/25): EF 20-25%, RWMAs, RV ok  Past Medical History:  Diagnosis Date   Back pain    Diabetes mellitus without complication (HCC)    Current Outpatient Medications  Medication Sig Dispense Refill   acetaminophen  (TYLENOL ) 650 MG CR tablet Take 650 mg by mouth every 8 (eight) hours as needed for pain.     atorvastatin  (LIPITOR ) 80 MG tablet Take 1 tablet (80 mg total) by mouth daily. 30 tablet 0   citalopram (CELEXA) 20 MG tablet Take 20 mg by mouth daily.     digoxin  (LANOXIN ) 0.125 MG tablet Take 1 tablet (0.125 mg total) by mouth daily. 90 tablet 3   DULoxetine (CYMBALTA) 30 MG capsule Take 30 mg by mouth daily.     empagliflozin  (JARDIANCE ) 10 MG TABS tablet Take 1 tablet (10 mg total) by mouth  daily. 30 tablet 11   furosemide  (LASIX ) 20 MG tablet Take 1 tablet (20 mg total) by mouth daily. 90 tablet 3   glipiZIDE (GLUCOTROL XL) 10 MG 24 hr tablet Take 10 mg by mouth every morning.     HYDROcodone -acetaminophen  (NORCO/VICODIN) 5-325 MG tablet Take 1 tablet by mouth every 4 (four) hours as needed. 10 tablet 0   metoprolol  succinate (TOPROL -XL) 25 MG 24 hr tablet Take 1 tablet (25 mg total) by mouth daily. Take with or immediately following a meal. 90 tablet 3   MOUNJARO 2.5 MG/0.5ML Pen INJECT 2.5 MG BY SUBCUTANEOUS ROUTE ONCE A WEEK FOR 4 WEEKS THEN INCREASE TO 5 MG WEEKLY.     pregabalin  (LYRICA ) 50 MG capsule Take 1 capsule (50 mg total) by mouth 3 (three) times daily. 90 capsule 0   sacubitril -valsartan  (ENTRESTO ) 24-26 MG Take 1 tablet by mouth 2 (two) times daily. 60 tablet 11   nicotine  (NICODERM CQ  - DOSED IN MG/24 HOURS) 14 mg/24hr patch Place 1 patch (14 mg total) onto the skin daily. (Patient not taking: Reported on 07/18/2024) 28 patch 0   No current facility-administered medications for this encounter.   Allergies  Allergen Reactions   Vancomycin Other (See Comments)    Other reaction(s): Red Man Syndrome (ALLERGY), Redness Red man syndrome Per patient.  Patient denies anaphylaxis as mentioned in OSH records    Social History   Socioeconomic History  Marital status: Married    Spouse name: Not on file   Number of children: Not on file   Years of education: Not on file   Highest education level: Not on file  Occupational History   Not on file  Tobacco Use   Smoking status: Some Days    Current packs/day: 0.25    Average packs/day: 0.3 packs/day for 46.1 years (11.5 ttl pk-yrs)    Types: Cigarettes    Start date: 06/23/1978   Smokeless tobacco: Never  Substance and Sexual Activity   Alcohol use: No   Drug use: Not Currently   Sexual activity: Not on file  Other Topics Concern   Not on file  Social History Narrative   Not on file   Social Drivers of  Health   Tobacco Use: High Risk (07/18/2024)   Patient History    Smoking Tobacco Use: Some Days    Smokeless Tobacco Use: Never    Passive Exposure: Not on file  Financial Resource Strain: Not on file  Food Insecurity: No Food Insecurity (04/17/2024)   Epic    Worried About Programme Researcher, Broadcasting/film/video in the Last Year: Never true    Ran Out of Food in the Last Year: Never true  Transportation Needs: No Transportation Needs (04/17/2024)   Epic    Lack of Transportation (Medical): No    Lack of Transportation (Non-Medical): No  Physical Activity: Not on file  Stress: Not on file  Social Connections: Not on file  Intimate Partner Violence: Not At Risk (04/17/2024)   Epic    Fear of Current or Ex-Partner: No    Emotionally Abused: No    Physically Abused: No    Sexually Abused: No  Depression (PHQ2-9): Not on file  Alcohol Screen: Not on file  Housing: Low Risk (04/17/2024)   Epic    Unable to Pay for Housing in the Last Year: No    Number of Times Moved in the Last Year: 0    Homeless in the Last Year: No  Utilities: Not At Risk (04/17/2024)   Epic    Threatened with loss of utilities: No  Health Literacy: Not on file   Family History  Problem Relation Age of Onset   Diabetes Mother    Diabetes Father    Alpha-1 antitrypsin deficiency Neg Hx    Lung cancer Neg Hx    Wt Readings from Last 3 Encounters:  07/18/24 128 kg (282 lb 3.2 oz)  05/22/24 123.1 kg (271 lb 6.4 oz)  04/30/24 126.1 kg (278 lb)   BP 136/80   Pulse 93   Wt 128 kg (282 lb 3.2 oz)   SpO2 94%   BMI 39.92 kg/m   PHYSICAL EXAM: General:  NAD. No resp difficulty, walked into clinic HEENT: Normal Neck: Supple. No JVD. Thick neck Cor: Regular rate & rhythm. No rubs, gallops or murmurs. Lungs: Clear Abdomen: Obese, nontender, nondistended.  Extremities: No cyanosis, clubbing, rash, edema Neuro: Alert & oriented x 3, moves all 4 extremities w/o difficulty. Affect pleasant.  ASSESSMENT & PLAN: 1. Chronic HFrEF,  NICM  - Recently diagnosed 11/25 - Echo 11/25: EF 20-25% .  - Cath with no CAD. Possible etiology HTN, cocaine, ? Viral.  - Unable to obtain CMRI due to claustrophobia.  - NYHA I. Volume OK  - Echo today 07/18/24 EF 45-50%. Discussed today. - Stop digoxin  with improved EF - Continue Toprol  XL 25 mg daily.   - Continue Lasix  20 mg daily -  Continue Entresto  24-26 mg bid  - Continue Jardiance  10 mg daily - Off spiro with hyperkalemia - Labs today   2. HTN - BP up a bit but has not had AM meds yet - Meds as above  3. Severe OSA - Untreated in 2023. Suspect he will need another sleep study.  - Referred to Pulmonary/Sleep Medicine, has an appt soon.  4. Cocaine Use  - Last used a week ago. - Discussed importance of cessation.   5. Tobacco Abuse - Had quit but recently started back - Discussed importance of continued cessation.  Follow up in 6 months with Dr. Cherrie Raisin [facility] FNP-BC  07/18/24 9:02 AM "

## 2024-07-17 NOTE — Telephone Encounter (Signed)
 Called to confirm/remind patient of their appointment at the [facility] on 07/18/24.   Appointment:   [x] Confirmed  [] Left mess   [] No answer/No voice mail  [] VM Full/unable to leave message  [] Phone not in service  Patient reminded to bring all medications and/or complete list.  Confirmed patient has transportation. Gave directions, instructed to utilize valet parking.

## 2024-07-18 ENCOUNTER — Ambulatory Visit (HOSPITAL_COMMUNITY)
Admission: RE | Admit: 2024-07-18 | Discharge: 2024-07-18 | Disposition: A | Source: Ambulatory Visit | Attending: Family Medicine

## 2024-07-18 ENCOUNTER — Encounter (HOSPITAL_COMMUNITY): Payer: Self-pay

## 2024-07-18 ENCOUNTER — Ambulatory Visit (HOSPITAL_COMMUNITY)
Admission: RE | Admit: 2024-07-18 | Discharge: 2024-07-18 | Disposition: A | Source: Ambulatory Visit | Attending: Family Medicine | Admitting: Family Medicine

## 2024-07-18 ENCOUNTER — Ambulatory Visit (HOSPITAL_COMMUNITY): Payer: Self-pay | Admitting: Family Medicine

## 2024-07-18 VITALS — BP 136/80 | HR 93 | Wt 282.2 lb

## 2024-07-18 DIAGNOSIS — F1490 Cocaine use, unspecified, uncomplicated: Secondary | ICD-10-CM | POA: Insufficient documentation

## 2024-07-18 DIAGNOSIS — Z79899 Other long term (current) drug therapy: Secondary | ICD-10-CM | POA: Insufficient documentation

## 2024-07-18 DIAGNOSIS — Z720 Tobacco use: Secondary | ICD-10-CM

## 2024-07-18 DIAGNOSIS — Z833 Family history of diabetes mellitus: Secondary | ICD-10-CM | POA: Insufficient documentation

## 2024-07-18 DIAGNOSIS — I5022 Chronic systolic (congestive) heart failure: Secondary | ICD-10-CM | POA: Diagnosis not present

## 2024-07-18 DIAGNOSIS — F1410 Cocaine abuse, uncomplicated: Secondary | ICD-10-CM | POA: Diagnosis not present

## 2024-07-18 DIAGNOSIS — I10 Essential (primary) hypertension: Secondary | ICD-10-CM

## 2024-07-18 DIAGNOSIS — G4733 Obstructive sleep apnea (adult) (pediatric): Secondary | ICD-10-CM | POA: Insufficient documentation

## 2024-07-18 DIAGNOSIS — F17210 Nicotine dependence, cigarettes, uncomplicated: Secondary | ICD-10-CM | POA: Insufficient documentation

## 2024-07-18 DIAGNOSIS — Z7984 Long term (current) use of oral hypoglycemic drugs: Secondary | ICD-10-CM | POA: Insufficient documentation

## 2024-07-18 DIAGNOSIS — E119 Type 2 diabetes mellitus without complications: Secondary | ICD-10-CM | POA: Insufficient documentation

## 2024-07-18 DIAGNOSIS — E875 Hyperkalemia: Secondary | ICD-10-CM | POA: Insufficient documentation

## 2024-07-18 DIAGNOSIS — F40240 Claustrophobia: Secondary | ICD-10-CM | POA: Insufficient documentation

## 2024-07-18 DIAGNOSIS — I428 Other cardiomyopathies: Secondary | ICD-10-CM | POA: Insufficient documentation

## 2024-07-18 DIAGNOSIS — I110 Hypertensive heart disease with heart failure: Secondary | ICD-10-CM | POA: Insufficient documentation

## 2024-07-18 DIAGNOSIS — Z7985 Long-term (current) use of injectable non-insulin antidiabetic drugs: Secondary | ICD-10-CM | POA: Insufficient documentation

## 2024-07-18 DIAGNOSIS — I77810 Thoracic aortic ectasia: Secondary | ICD-10-CM | POA: Insufficient documentation

## 2024-07-18 LAB — BASIC METABOLIC PANEL WITH GFR
Anion gap: 8 (ref 5–15)
BUN: 21 mg/dL (ref 8–23)
CO2: 34 mmol/L — ABNORMAL HIGH (ref 22–32)
Calcium: 10.5 mg/dL — ABNORMAL HIGH (ref 8.9–10.3)
Chloride: 100 mmol/L (ref 98–111)
Creatinine, Ser: 1.2 mg/dL (ref 0.61–1.24)
GFR, Estimated: >60 mL/min
Glucose, Bld: 130 mg/dL — ABNORMAL HIGH (ref 70–99)
Potassium: 4.6 mmol/L (ref 3.5–5.1)
Sodium: 142 mmol/L (ref 135–145)

## 2024-07-18 LAB — ECHOCARDIOGRAM COMPLETE
Area-P 1/2: 4.89 cm2
S' Lateral: 4.5 cm
Single Plane A4C EF: 42.6 %

## 2024-07-18 NOTE — Patient Instructions (Addendum)
 Good to see you today!  STOP digoxin   Labs done today, your results will be available in MyChart, we will contact you for abnormal readings.  Your physician recommends that you schedule a follow-up appointment  6 months(August) Call office in June to schedule an appointment  If you have any questions or concerns before your next appointment please send us  a message through [place] or call our office at [phone].    TO LEAVE A MESSAGE FOR THE NURSE SELECT OPTION 2, PLEASE LEAVE A MESSAGE INCLUDING: YOUR NAME DATE OF BIRTH CALL BACK NUMBER REASON FOR CALL**this is important as we prioritize the call backs  YOU WILL RECEIVE A CALL BACK THE SAME DAY AS LONG AS YOU CALL BEFORE 4:00 PM At the [facility], you and your health needs are our priority. As part of our continuing mission to provide you with exceptional heart care, we have created designated Provider Care Teams. These Care Teams include your primary Cardiologist (physician) and Advanced Practice Providers (APPs- Physician Assistants and Nurse Practitioners) who all work together to provide you with the care you need, when you need it.   You may see any of the following providers on your designated Care Team at your next follow up: Dr Toribio Fuel Dr Ezra Shuck Dr. Morene Brownie Greig Mosses, NP Caffie Shed, [place] [facility] [place], [place] Beckey Coe, NP Jordan Lee, NP Ellouise Class, NP Tinnie Redman, PharmD Jaun Bash, PharmD   Please be sure to bring in all your medications bottles to every appointment.    Thank you for choosing [facility]

## 2024-07-18 NOTE — Addendum Note (Signed)
 Encounter addended by: Travon Crochet M, RN on: 07/18/2024 9:25 AM  Actions taken: Charge Capture section accepted

## 2024-08-01 ENCOUNTER — Ambulatory Visit: Admitting: Pharmacist

## 2024-08-01 ENCOUNTER — Ambulatory Visit (HOSPITAL_BASED_OUTPATIENT_CLINIC_OR_DEPARTMENT_OTHER)
